# Patient Record
Sex: Male | Born: 1965 | Hispanic: Yes | Marital: Married | Smoking: Never smoker
Health system: Southern US, Community
[De-identification: ages and names within clinical notes are randomized; demographics above are authoritative.]

## PROBLEM LIST (undated history)

## (undated) DIAGNOSIS — K219 Gastro-esophageal reflux disease without esophagitis: Secondary | ICD-10-CM

## (undated) DIAGNOSIS — E785 Hyperlipidemia, unspecified: Secondary | ICD-10-CM

## (undated) HISTORY — DX: Hyperlipidemia, unspecified: E78.5

## (undated) HISTORY — PX: HERNIA REPAIR: SHX51

## (undated) HISTORY — DX: Gastro-esophageal reflux disease without esophagitis: K21.9

## (undated) HISTORY — PX: APPENDECTOMY: SHX54

---

## 2019-10-30 ENCOUNTER — Telehealth: Payer: Self-pay | Admitting: Nurse Practitioner

## 2019-10-30 ENCOUNTER — Other Ambulatory Visit: Payer: Self-pay

## 2019-10-30 ENCOUNTER — Emergency Department (HOSPITAL_COMMUNITY): Payer: HRSA Program

## 2019-10-30 ENCOUNTER — Emergency Department (HOSPITAL_COMMUNITY)
Admission: EM | Admit: 2019-10-30 | Discharge: 2019-10-30 | Disposition: A | Discharge status: Home / Self Care | Payer: HRSA Program | Attending: Emergency Medicine | Admitting: Emergency Medicine

## 2019-10-30 ENCOUNTER — Encounter (HOSPITAL_COMMUNITY): Payer: Self-pay | Admitting: Emergency Medicine

## 2019-10-30 DIAGNOSIS — R05 Cough: Secondary | ICD-10-CM | POA: Diagnosis present

## 2019-10-30 DIAGNOSIS — J069 Acute upper respiratory infection, unspecified: Secondary | ICD-10-CM

## 2019-10-30 DIAGNOSIS — R110 Nausea: Secondary | ICD-10-CM | POA: Insufficient documentation

## 2019-10-30 DIAGNOSIS — R509 Fever, unspecified: Secondary | ICD-10-CM | POA: Diagnosis not present

## 2019-10-30 DIAGNOSIS — R1084 Generalized abdominal pain: Secondary | ICD-10-CM | POA: Insufficient documentation

## 2019-10-30 DIAGNOSIS — R197 Diarrhea, unspecified: Secondary | ICD-10-CM | POA: Diagnosis not present

## 2019-10-30 DIAGNOSIS — U071 COVID-19: Secondary | ICD-10-CM | POA: Insufficient documentation

## 2019-10-30 LAB — CBC
HCT: 41.4 % (ref 39.0–52.0)
Hemoglobin: 14.2 g/dL (ref 13.0–17.0)
MCH: 29 pg (ref 26.0–34.0)
MCHC: 34.3 g/dL (ref 30.0–36.0)
MCV: 84.5 fL (ref 80.0–100.0)
Platelets: 127 10*3/uL — ABNORMAL LOW (ref 150–400)
RBC: 4.9 MIL/uL (ref 4.22–5.81)
RDW: 12 % (ref 11.5–15.5)
WBC: 3.6 10*3/uL — ABNORMAL LOW (ref 4.0–10.5)
nRBC: 0 % (ref 0.0–0.2)

## 2019-10-30 LAB — URINALYSIS, ROUTINE W REFLEX MICROSCOPIC
Bacteria, UA: NONE SEEN
Bilirubin Urine: NEGATIVE
Glucose, UA: NEGATIVE mg/dL
Hgb urine dipstick: NEGATIVE
Ketones, ur: NEGATIVE mg/dL
Leukocytes,Ua: NEGATIVE
Nitrite: NEGATIVE
Protein, ur: 100 mg/dL — AB
Specific Gravity, Urine: 1.028 (ref 1.005–1.030)
pH: 6 (ref 5.0–8.0)

## 2019-10-30 LAB — COMPREHENSIVE METABOLIC PANEL
ALT: 21 U/L (ref 0–44)
AST: 41 U/L (ref 15–41)
Albumin: 3.4 g/dL — ABNORMAL LOW (ref 3.5–5.0)
Alkaline Phosphatase: 62 U/L (ref 38–126)
Anion gap: 10 (ref 5–15)
BUN: 14 mg/dL (ref 6–20)
CO2: 23 mmol/L (ref 22–32)
Calcium: 8.2 mg/dL — ABNORMAL LOW (ref 8.9–10.3)
Chloride: 100 mmol/L (ref 98–111)
Creatinine, Ser: 0.87 mg/dL (ref 0.61–1.24)
GFR calc Af Amer: >60 mL/min (ref >60)
GFR calc non Af Amer: >60 mL/min (ref >60)
Glucose, Bld: 118 mg/dL — ABNORMAL HIGH (ref 70–99)
Potassium: 3.8 mmol/L (ref 3.5–5.1)
Sodium: 133 mmol/L — ABNORMAL LOW (ref 135–145)
Total Bilirubin: 1.4 mg/dL — ABNORMAL HIGH (ref 0.3–1.2)
Total Protein: 6.7 g/dL (ref 6.5–8.1)

## 2019-10-30 LAB — POC SARS CORONAVIRUS 2 AG -  ED: SARS Coronavirus 2 Ag: NEGATIVE

## 2019-10-30 LAB — LIPASE, BLOOD: Lipase: 32 U/L (ref 11–51)

## 2019-10-30 LAB — SARS CORONAVIRUS 2 (TAT 6-24 HRS): SARS Coronavirus 2: POSITIVE — AB

## 2019-10-30 MED ORDER — SODIUM CHLORIDE 0.9 % IV BOLUS
1000.0000 mL | Freq: Once | INTRAVENOUS | Status: AC
  Administered 2019-10-30: 17:00:00 1000 mL via INTRAVENOUS

## 2019-10-30 MED ORDER — ACETAMINOPHEN 325 MG PO TABS
650.0000 mg | ORAL_TABLET | Freq: Once | ORAL | Status: AC
  Administered 2019-10-30: 650 mg via ORAL

## 2019-10-30 MED ORDER — ONDANSETRON HCL 4 MG PO TABS: 4.0000 mg | ORAL_TABLET | Freq: Four times a day (QID) | ORAL | 0 refills | Status: DC

## 2019-10-30 MED ORDER — ONDANSETRON HCL 4 MG/2ML IJ SOLN
4.0000 mg | Freq: Once | INTRAMUSCULAR | Status: AC
  Administered 2019-10-30: 17:00:00 4 mg via INTRAVENOUS
  Filled 2019-10-30: qty 2

## 2019-10-30 MED ORDER — HYDROCOD POLST-CPM POLST ER 10-8 MG/5ML PO SUER
5.0000 mL | Freq: Once | ORAL | Status: AC
  Administered 2019-10-30: 5 mL via ORAL
  Filled 2019-10-30: qty 5

## 2019-10-30 MED ORDER — ACETAMINOPHEN 325 MG PO TABS
650.0000 mg | ORAL_TABLET | Freq: Once | ORAL | Status: AC
  Administered 2019-10-30: 650 mg via ORAL
  Filled 2019-10-30: qty 2

## 2019-10-30 MED ORDER — PROMETHAZINE-DM 6.25-15 MG/5ML PO SYRP: 5.0000 mL | ORAL_SOLUTION | Freq: Four times a day (QID) | ORAL | 0 refills | Status: DC | PRN

## 2019-10-30 NOTE — ED Provider Notes (Signed)
[facility] EMERGENCY DEPARTMENT Provider Note   CSN: [id number] Arrival date & time: 10/30/19  1108     History Chief Complaint  Patient presents with  . Cough  . Abdominal Pain    Dean Henry is a 54 y.o. male.  HPI 54 year old male with no significant medical history presents the ER complaining of fever, nausea, diarrhea, cough for approximately 10 days now.  History provided by the patient and his daughter-in-law who served as a Optometrist.  Approximately a month ago his son and daughter tested positive for Covid.  He states that he has had worsening cough, having coughing spells that make it hard for him to breathe with associated dizziness, shortness of breath for approximately 10 days now.  He states he feels as though there is something in his chest that is preventing him from breathing.  Endorses diffuse abdominal pain and watery diarrhea but no vomiting.  Endorses loss of taste and smell.  Endorses chest pain, but no palpitations, syncope, no radiation to shoulder or down arms, no swelling of the calfs/legs.  He has tried taking Tessalon Perles, Tylenol, Robitussin for his symptoms with minimal relief.  Denies hemoptysis, syncope, hematuria, dysuria, hematochezia.  He does not have a history of COPD, MIs, heart failure, PE, hypertension, AAA.     History reviewed. No pertinent past medical history.  There are no problems to display for this patient.   History reviewed. No pertinent surgical history.     No family history on file.  Social History   Tobacco Use  . Smoking status: Not on file  Substance Use Topics  . Alcohol use: Not on file  . Drug use: Not on file    Home Medications Prior to Admission medications   Medication Sig Start Date End Date Taking? Authorizing Provider  ondansetron (ZOFRAN) 4 MG tablet Take 1 tablet (4 mg total) by mouth every 6 (six) hours. 10/30/19   Garald Balding, PA-C  promethazine-dextromethorphan  (PROMETHAZINE-DM) 6.25-15 MG/5ML syrup Take 5 mLs by mouth 4 (four) times daily as needed for cough. 10/30/19   Garald Balding, PA-C    Allergies    Patient has no allergy information on record.  Review of Systems   Review of Systems  Constitutional: Positive for activity change, appetite change, chills, fatigue and fever.  HENT: Negative for ear pain, sinus pressure, sinus pain, sneezing, sore throat and trouble swallowing.   Respiratory: Positive for cough, chest tightness and shortness of breath. Negative for choking and wheezing.   Cardiovascular: Positive for chest pain. Negative for palpitations.  Gastrointestinal: Positive for abdominal pain, diarrhea and nausea. Negative for blood in stool, constipation and vomiting.  Genitourinary: Negative for dysuria, flank pain and hematuria.  Musculoskeletal: Positive for arthralgias and myalgias. Negative for back pain, neck pain and neck stiffness.  Skin: Negative for rash.  Neurological: Positive for dizziness, light-headedness and headaches. Negative for syncope and numbness.  Psychiatric/Behavioral: Negative for confusion.    Physical Exam Updated Vital Signs BP 115/75 (BP Location: Left Arm)   Pulse (!) 106   Temp (!) 103.2 F (39.6 C) (Oral)   Resp 20   Ht 5\' 7"  (1.702 m)   Wt 104.3 kg   SpO2 95%   BMI 36.02 kg/m   Physical Exam Vitals and nursing note reviewed.  Constitutional:      Appearance: He is well-developed.  HENT:     Head: Normocephalic and atraumatic.  Eyes:     Conjunctiva/sclera: Conjunctivae normal.  Cardiovascular:     Rate and Rhythm: Regular rhythm. Tachycardia present.     Pulses:          Radial pulses are 2+ on the right side and 2+ on the left side.       Dorsalis pedis pulses are 2+ on the right side and 2+ on the left side.     Heart sounds: Normal heart sounds. No murmur.  Pulmonary:     Effort: Pulmonary effort is normal. No respiratory distress.     Breath sounds: Normal breath sounds.    Abdominal:     General: Bowel sounds are increased. There is no distension.     Palpations: Abdomen is soft.     Tenderness: There is generalized abdominal tenderness. There is no right CVA tenderness, left CVA tenderness or guarding. Negative signs include Murphy's sign and McBurney's sign.     Hernia: No hernia is present.  Musculoskeletal:     Cervical back: Neck supple.     Right lower leg: No edema.     Left lower leg: No edema.  Skin:    General: Skin is warm and dry.  Neurological:     Mental Status: He is alert.     ED Results / Procedures / Treatments   Labs (all labs ordered are listed, but only abnormal results are displayed) Labs Reviewed  COMPREHENSIVE METABOLIC PANEL - Abnormal; Notable for the following components:      Result Value   Sodium 133 (*)    Glucose, Bld 118 (*)    Calcium 8.2 (*)    Albumin 3.4 (*)    Total Bilirubin 1.4 (*)    All other components within normal limits  CBC - Abnormal; Notable for the following components:   WBC 3.6 (*)    Platelets 127 (*)    All other components within normal limits  URINALYSIS, ROUTINE W REFLEX MICROSCOPIC - Abnormal; Notable for the following components:   Color, Urine AMBER (*)    Protein, ur 100 (*)    All other components within normal limits  SARS CORONAVIRUS 2 (TAT 6-24 HRS)  LIPASE, BLOOD  POC SARS CORONAVIRUS 2 AG -  ED    EKG EKG Interpretation  Date/Time:  Wednesday October 30 2019 16:46:28 EDT Ventricular Rate:  105 PR Interval:  164 QRS Duration: 84 QT Interval:  342 QTC Calculation: 452 R Axis:   67 Text Interpretation: Sinus tachycardia Otherwise normal ECG No old tracing to compare Confirmed by Dorie Rank [phone]) on 10/30/2019 4:57:34 PM   Radiology DG Chest Portable 1 View  Result Date: 10/30/2019 CLINICAL DATA:  Cough, fever EXAM: PORTABLE CHEST 1 VIEW COMPARISON:  12/10/2015 FINDINGS: The heart size and mediastinal contours are within normal limits. Subtle patchy airspace opacities  are seen involving the peripheral aspects of both lungs. No pleural effusion or pneumothorax identified. The visualized skeletal structures are unremarkable. IMPRESSION: Subtle patchy airspace opacities involving the peripheral aspects of both lungs. Findings concerning for atypical/viral infection versus edema. Electronically Signed   By: Davina Poke D.O.   On: 10/30/2019 16:42    Procedures Procedures (including critical care time)  Medications Ordered in ED Medications  acetaminophen (TYLENOL) tablet 650 mg (650 mg Oral Given 10/30/19 1124)  ondansetron (ZOFRAN) injection 4 mg (4 mg Intravenous Given 10/30/19 1639)  sodium chloride 0.9 % bolus 1,000 mL (0 mLs Intravenous Stopped 10/30/19 1819)  chlorpheniramine-HYDROcodone (TUSSIONEX) 10-8 MG/5ML suspension 5 mL (5 mLs Oral Given 10/30/19 1639)  acetaminophen (TYLENOL) tablet  650 mg (650 mg Oral Given 10/30/19 1830)    ED Course  I have reviewed the triage vital signs and the nursing notes.  Pertinent labs & imaging results that were available during my care of the patient were reviewed by me and considered in my medical decision making (see chart for details).    MDM Rules/Calculators/A&P                     54 year old male with complaints of fever, cough, diarrhea for approximately 10 days. On presentation to the ER, patient is ill-appearing, but in no acute distress, having intermittent coughing spells.  Patient is able to speak in full sentences without accessory muscle use or respiratory distress.  He is febrile with a temperature of 103 on arrival to the ED, tachycardic, normotensive, satting 94% on room air. On physical exam, clear lungs, diffuse abdominal tenderness to palpation.  No noticeable swelling to the legs or calves. CMP with mild hyponatremia of 133, likely due to diarrhea and dehydration.  CBC with mild leukopenia.  Urinalysis consistent with dehydration.  Overall without significant electrolyte abnormalities or renal  dysfunction.  Lipase negative.  Chest x-ray shows patchy airspace opacities concerning for atypical/viral infection versus edema.  EKG shows sinus tach with no ischemic changes.  Patient was given 2 rounds of Tylenol and 1 L fluid bolus while in the ER.  On reevaluation, he notes an improvement in his respiratory symptoms and nausea.  Repeat abdominal exam does not show signs of an acute abdomen.  He is not having any chest pain currently or showing signs of decompensation.  Shortness of breath slightly improved.  Lungs sound clear.  Given this clinical picture, symptoms, and along with his recent exposure to COVID-19 from his family, I suspect this patient's symptoms are due to  COVID-19. Although rapid test is negative, will order the 6-24hr COVID test. Expect this to be positive. Chest x-ray suggestive of atypical/viral etiology of symptoms with patchy airspace opacities further confirming my suspicion.    Other differentials include pneumonia, COPD, PE, ACS, dissection, HF, AAA, gastritis, gastroenteritis, pancreatitis, PUD, cholecystitis, appendicitis, diverticulitis.  Discussed pending results of the COVID-19 6 to 24-hour test.  Instructed patient to quarantine until the results of his tests are known, and he may require further quarantine if his test is positive.  Drink lots of fluids, continue Tylenol for fever control.  Prescribed Promethazine DM for cough and Zofran for nausea.  Strict return precautions given to him and his daughter in law, which included worsening shortness of breath, chest pain that does not seem to improve, worsening cough, persistent fever, syncope, increased confusion, etc.   Final Clinical Impression(s) / ED Diagnoses Final diagnoses:  Viral URI    Rx / DC Orders ED Discharge Orders         Ordered    promethazine-dextromethorphan (PROMETHAZINE-DM) 6.25-15 MG/5ML syrup  4 times daily PRN     10/30/19 1814    ondansetron (ZOFRAN) 4 MG tablet  Every 6 hours      10/30/19 1814           Lyndel Safe 10/30/19 1910    Dorie Rank, MD 10/31/19 1032

## 2019-10-30 NOTE — ED Notes (Signed)
Verbalized understanding of DC instructions, Rx, follow up care 

## 2019-10-30 NOTE — Discharge Instructions (Addendum)
You were seen in the ER for cough, fever, nausea and diarrhea. It is likely that your symptoms are caused by COVID-19. Your COVID test may take up to 24-48 hours to come back. You may check this via MyChart and if it is positive you will receive a call from the Health Department. . Please make sure to quarantine while you wait for the resuts, and quarantine 7-10 days if your test is positive. Please follow up with your family doctor.  Make sure to drink lots of fluids and take Tylenol for fevers. I have prescribed a nausea medication called Zofran and Phenergan syrup for cough. Please take as directed. Return to the ER if symptoms worsen.

## 2019-10-30 NOTE — ED Triage Notes (Signed)
Pt endorses fever and cough for 12 days. Son and daughter had covid about a month ago. Endorses nausea and diarrhea.

## 2019-11-01 ENCOUNTER — Telehealth: Payer: Self-pay

## 2019-11-01 ENCOUNTER — Ambulatory Visit: Payer: Self-pay | Admitting: Nurse Practitioner

## 2019-11-01 ENCOUNTER — Telehealth (HOSPITAL_COMMUNITY): Payer: Self-pay | Admitting: Nurse Practitioner

## 2019-11-01 DIAGNOSIS — U071 COVID-19: Secondary | ICD-10-CM

## 2019-11-01 NOTE — Telephone Encounter (Signed)
Called to Discuss with patient about Covid symptoms and the use of bamlanivimab, a monoclonal antibody infusion for those with mild to moderate Covid symptoms and at a high risk of hospitalization.     Pt is qualified for this infusion at the [facility] infusion center due to co-morbid conditions and/or a member of an at-risk group.     Unable to reach pt. With the assistance of an interpreter due to language barrier, a message was left with phone number for return call.   Beckey Rutter, [place], AGNP-C [phone] ([place])

## 2019-11-01 NOTE — Telephone Encounter (Signed)
Raiford Noble called to report that her Dad was seen in the ED at [facility] 48 hours ago and generally is not feeling better.  His rapid covid test was negative but they have not been informed of his send-out results.  The results are positive and patient's daughter is going to call the infusion center and make them aware that her Dad was seen in the ED.

## 2019-11-02 ENCOUNTER — Emergency Department (HOSPITAL_COMMUNITY): Payer: HRSA Program

## 2019-11-02 ENCOUNTER — Encounter (HOSPITAL_COMMUNITY): Payer: Self-pay | Admitting: Emergency Medicine

## 2019-11-02 ENCOUNTER — Other Ambulatory Visit: Payer: Self-pay

## 2019-11-02 ENCOUNTER — Emergency Department (HOSPITAL_COMMUNITY)
Admission: EM | Admit: 2019-11-02 | Discharge: 2019-11-02 | Disposition: A | Discharge status: Home / Self Care | Payer: HRSA Program | Attending: Emergency Medicine | Admitting: Emergency Medicine

## 2019-11-02 DIAGNOSIS — R05 Cough: Secondary | ICD-10-CM | POA: Diagnosis present

## 2019-11-02 DIAGNOSIS — J1282 Pneumonia due to coronavirus disease 2019: Secondary | ICD-10-CM | POA: Insufficient documentation

## 2019-11-02 DIAGNOSIS — Z79899 Other long term (current) drug therapy: Secondary | ICD-10-CM | POA: Insufficient documentation

## 2019-11-02 DIAGNOSIS — U071 COVID-19: Secondary | ICD-10-CM | POA: Diagnosis not present

## 2019-11-02 LAB — BASIC METABOLIC PANEL
Anion gap: 12 (ref 5–15)
BUN: 9 mg/dL (ref 6–20)
CO2: 24 mmol/L (ref 22–32)
Calcium: 8.2 mg/dL — ABNORMAL LOW (ref 8.9–10.3)
Chloride: 99 mmol/L (ref 98–111)
Creatinine, Ser: 0.83 mg/dL (ref 0.61–1.24)
GFR calc Af Amer: >60 mL/min (ref >60)
GFR calc non Af Amer: >60 mL/min (ref >60)
Glucose, Bld: 134 mg/dL — ABNORMAL HIGH (ref 70–99)
Potassium: 3.6 mmol/L (ref 3.5–5.1)
Sodium: 135 mmol/L (ref 135–145)

## 2019-11-02 LAB — URINALYSIS, ROUTINE W REFLEX MICROSCOPIC
Bacteria, UA: NONE SEEN
Bilirubin Urine: NEGATIVE
Glucose, UA: NEGATIVE mg/dL
Hgb urine dipstick: NEGATIVE
Ketones, ur: NEGATIVE mg/dL
Leukocytes,Ua: NEGATIVE
Nitrite: NEGATIVE
Protein, ur: 100 mg/dL — AB
Specific Gravity, Urine: 1.028 (ref 1.005–1.030)
pH: 6 (ref 5.0–8.0)

## 2019-11-02 LAB — CBC WITH DIFFERENTIAL/PLATELET
Abs Immature Granulocytes: 0.02 10*3/uL (ref 0.00–0.07)
Basophils Absolute: 0 10*3/uL (ref 0.0–0.1)
Basophils Relative: 0 %
Eosinophils Absolute: 0 10*3/uL (ref 0.0–0.5)
Eosinophils Relative: 0 %
HCT: 39.7 % (ref 39.0–52.0)
Hemoglobin: 13.4 g/dL (ref 13.0–17.0)
Immature Granulocytes: 1 %
Lymphocytes Relative: 18 %
Lymphs Abs: 0.6 10*3/uL — ABNORMAL LOW (ref 0.7–4.0)
MCH: 29.1 pg (ref 26.0–34.0)
MCHC: 33.8 g/dL (ref 30.0–36.0)
MCV: 86.3 fL (ref 80.0–100.0)
Monocytes Absolute: 0.3 10*3/uL (ref 0.1–1.0)
Monocytes Relative: 7 %
Neutro Abs: 2.5 10*3/uL (ref 1.7–7.7)
Neutrophils Relative %: 74 %
Platelets: 175 10*3/uL (ref 150–400)
RBC: 4.6 MIL/uL (ref 4.22–5.81)
RDW: 12.2 % (ref 11.5–15.5)
WBC: 3.4 10*3/uL — ABNORMAL LOW (ref 4.0–10.5)
nRBC: 0 % (ref 0.0–0.2)

## 2019-11-02 MED ORDER — DOXYCYCLINE HYCLATE 100 MG PO TABS
100.0000 mg | ORAL_TABLET | Freq: Once | ORAL | Status: AC
  Administered 2019-11-02: 100 mg via ORAL
  Filled 2019-11-02: qty 1

## 2019-11-02 MED ORDER — DOXYCYCLINE HYCLATE 100 MG PO TABS: 100.0000 mg | ORAL_TABLET | Freq: Two times a day (BID) | ORAL | 0 refills | Status: DC

## 2019-11-02 MED ORDER — DEXAMETHASONE 4 MG PO TABS: 4.0000 mg | ORAL_TABLET | Freq: Two times a day (BID) | ORAL | 0 refills | Status: DC

## 2019-11-02 MED ORDER — DEXAMETHASONE 4 MG PO TABS
4.0000 mg | ORAL_TABLET | Freq: Once | ORAL | Status: AC
  Administered 2019-11-02: 4 mg via ORAL
  Filled 2019-11-02: qty 1

## 2019-11-02 NOTE — ED Notes (Signed)
Pt ambulatory in room, denies [facility] with exertion, o2 sats remain at 96 % on RA, HR 100.

## 2019-11-02 NOTE — ED Triage Notes (Signed)
Patient tested positive for COVID19 on 10/30/2019 reports fever , chills , lost of taste , cough , fatigue , abdominal pain and body aches .

## 2019-11-02 NOTE — Discharge Instructions (Signed)
Return if any problems.

## 2019-11-02 NOTE — ED Provider Notes (Signed)
[facility] EMERGENCY DEPARTMENT Provider Note   CSN: [id number] Arrival date & time: 11/02/19  0326     History Chief Complaint  Patient presents with  . COVID+ / Fever / Chills/Body Aches / Abdominal pain    Dean Henry is a 54 y.o. male.  The history is provided by the patient. No language interpreter was used.  Cough Cough characteristics:  Productive Sputum characteristics:  Nondescript Severity:  Moderate Onset quality:  Gradual Duration:  15 days Timing:  Constant Progression:  Worsening Chronicity:  New Smoker: yes   Relieved by:  Nothing Worsened by:  Nothing Ineffective treatments:  None tried Associated symptoms: no shortness of breath   Risk factors: no recent infection   Pt tested positive for covid.  Pt reports increased shortness of breath     History reviewed. No pertinent past medical history.  There are no problems to display for this patient.   History reviewed. No pertinent surgical history.     No family history on file.  Social History   Tobacco Use  . Smoking status: Never Smoker  . Smokeless tobacco: Never Used  Substance Use Topics  . Alcohol use: Never  . Drug use: Never    Home Medications Prior to Admission medications   Medication Sig Start Date End Date Taking? Authorizing Provider  dexamethasone (DECADRON) 4 MG tablet Take 1 tablet (4 mg total) by mouth 2 (two) times daily with a meal. 11/02/19   Fransico Meadow, PA-C  doxycycline (VIBRA-TABS) 100 MG tablet Take 1 tablet (100 mg total) by mouth 2 (two) times daily. 11/02/19   Fransico Meadow, PA-C  ondansetron (ZOFRAN) 4 MG tablet Take 1 tablet (4 mg total) by mouth every 6 (six) hours. 10/30/19   Garald Balding, PA-C  promethazine-dextromethorphan (PROMETHAZINE-DM) 6.25-15 MG/5ML syrup Take 5 mLs by mouth 4 (four) times daily as needed for cough. 10/30/19   Garald Balding, PA-C    Allergies    Patient has no known allergies.  Review of Systems     Review of Systems  Respiratory: Positive for cough. Negative for shortness of breath.   All other systems reviewed and are negative.   Physical Exam Updated Vital Signs BP 127/76   Pulse 84   Temp 98.2 F (36.8 C) (Oral)   Resp 16   Ht 5\' 7"  (1.702 m)   Wt 110 kg   SpO2 96%   BMI 37.98 kg/m   Physical Exam Vitals reviewed.  HENT:     Mouth/Throat:     Mouth: Mucous membranes are moist.  Cardiovascular:     Rate and Rhythm: Normal rate.  Pulmonary:     Effort: Pulmonary effort is normal.     Breath sounds: Rhonchi present.  Abdominal:     General: Abdomen is flat.  Musculoskeletal:        General: Normal range of motion.     Cervical back: Normal range of motion.  Skin:    General: Skin is warm.  Neurological:     General: No focal deficit present.     Mental Status: He is alert.  Psychiatric:        Mood and Affect: Mood normal.     ED Results / Procedures / Treatments   Labs (all labs ordered are listed, but only abnormal results are displayed) Labs Reviewed  CBC WITH DIFFERENTIAL/PLATELET - Abnormal; Notable for the following components:      Result Value   WBC 3.4 (*)  Lymphs Abs 0.6 (*)    All other components within normal limits  BASIC METABOLIC PANEL - Abnormal; Notable for the following components:   Glucose, Bld 134 (*)    Calcium 8.2 (*)    All other components within normal limits  URINALYSIS, ROUTINE W REFLEX MICROSCOPIC - Abnormal; Notable for the following components:   Color, Urine AMBER (*)    Protein, ur 100 (*)    All other components within normal limits    EKG None  Radiology DG Chest Portable 1 View  Result Date: 11/02/2019 CLINICAL DATA:  COVID positive.  Fever short of breath EXAM: PORTABLE CHEST 1 VIEW COMPARISON:  Radiograph 10/30/2019 FINDINGS: Normal cardiac silhouette. There are patchy peripheral airspace opacities. No pleural fluid. No pneumothorax. No acute osseous abnormality. IMPRESSION: Patchy peripheral airspace  opacities consistent with COVID pneumonia. Electronically Signed   By: Suzy Bouchard M.D.   On: 11/02/2019 05:03    Procedures Procedures (including critical care time)  Medications Ordered in ED Medications  doxycycline (VIBRA-TABS) tablet 100 mg (100 mg Oral Given 11/02/19 0927)  dexamethasone (DECADRON) tablet 4 mg (4 mg Oral Given 11/02/19 [id number])    ED Course  I have reviewed the triage vital signs and the nursing notes.  Pertinent labs & imaging results that were available during my care of the patient were reviewed by me and considered in my medical decision making (see chart for details).    MDM Rules/Calculators/A&P                      MDM:  Chest xray shows pneumonia, Pt counseled on results,  Pt given rx for doxycycline and decadron.   Final Clinical Impression(s) / ED Diagnoses Final diagnoses:  Pneumonia due to COVID-19 virus    Rx / DC Orders ED Discharge Orders         Ordered    doxycycline (VIBRA-TABS) 100 MG tablet  2 times daily     11/02/19 0910    dexamethasone (DECADRON) 4 MG tablet  2 times daily with meals     11/02/19 0910        An After Visit Summary was printed and given to the patient.    Fransico Meadow, PA-C 11/02/19 1500    Isla Pence, MD 11/02/19 [phone]

## 2019-11-26 ENCOUNTER — Encounter: Payer: Self-pay | Admitting: Family Medicine

## 2019-11-26 NOTE — Progress Notes (Signed)
Subjective:  Patient ID: Dean Henry, male    DOB: 02/14/1966  Age: 54 y.o. MRN: [id number]  Chief Complaint  Patient presents with  . Gastroesophageal Reflux    HPI Patient is a Hispanic male who was seen in January by [facility] midlevel providers.  She tested him for H. pylori and he was subsequently treated for this.  He has heartburn has significantly improved.  He does occasionally have heartburn.  He does not take anything regularly although he does have omeprazole at home.  iSymptoms has been relieved with the H. pylori triple treatment.  Denies nausea, vomiting, diarrhea, constipation.  Patient was recently seen in [facility] emergency department in early April 2020 with COVID-19 pneumonia.  He was treated as an outpatient.  He did gradually improve and is feeling much better now.  His only remaining symptom is a cough.  He has been taking Robitussin.   The patient has a strong family history of colon cancer.  His older brother who died at age 63 was diagnosed with colon cancer. Medical history is significant for family history of colon cancer.  The patient denies ever having a colonoscopy, flexible sigmoidoscopy, or barium enema in the past.    Past Medical History:  Diagnosis Date  . GERD (gastroesophageal reflux disease)    Past Surgical History:  Procedure Laterality Date  . APPENDECTOMY      Family History  Problem Relation Age of Onset  . Cancer Mother        ovarian  . Kidney failure Father   . Cancer Brother        colon   Social History   Socioeconomic History  . Marital status: Married    Spouse name: Not on file  . Number of children: Not on file  . Years of education: Not on file  . Highest education level: Not on file  Occupational History  . Not on file  Tobacco Use  . Smoking status: Never Smoker  . Smokeless tobacco: Never Used  Substance and Sexual Activity  . Alcohol use: Yes    Comment: socially  . Drug use: Never  .  Sexual activity: Not on file  Other Topics Concern  . Not on file  Social History Narrative  . Not on file   Social Determinants of Health   Financial Resource Strain:   . Difficulty of Paying Living Expenses:   Food Insecurity:   . Worried About Charity fundraiser in the Last Year:   . Arboriculturist in the Last Year:   Transportation Needs:   . Film/video editor (Medical):   Marland Kitchen Lack of Transportation (Non-Medical):   Physical Activity:   . Days of Exercise per Week:   . Minutes of Exercise per Session:   Stress:   . Feeling of Stress :   Social Connections:   . Frequency of Communication with Friends and Family:   . Frequency of Social Gatherings with Friends and Family:   . Attends Religious Services:   . Active Member of Clubs or Organizations:   . Attends Archivist Meetings:   Marland Kitchen Marital Status:     Review of Systems  Constitutional: Negative for chills, diaphoresis, fatigue and fever.  HENT: Negative for congestion, ear pain and sore throat.   Respiratory: Negative for cough and shortness of breath.   Cardiovascular: Negative for chest pain and leg swelling.  Gastrointestinal: Negative for abdominal pain, constipation, diarrhea, nausea and  vomiting.  Genitourinary: Negative for dysuria and urgency.  Musculoskeletal: Negative for arthralgias and myalgias.  Neurological: Negative for dizziness and headaches.  Psychiatric/Behavioral: Negative for dysphoric mood.     Objective:  BP 110/66   Pulse 76   Temp (!) 97.4 F (36.3 C)   Resp 16   Ht 5\' 7"  (1.702 m)   Wt 221 lb 6.4 oz (100.4 kg)   BMI 34.68 kg/m   BP/Weight 11/28/2019 [id number]  Systolic BP [id number]  Diastolic BP 66 76 75  Wt. (Lbs) 221.4 242.51 230  BMI 34.68 37.98 36.02    Physical Exam Vitals reviewed.  Constitutional:      Appearance: Normal appearance. He is normal weight.  Cardiovascular:     Rate and Rhythm: Normal rate and regular rhythm.  Pulmonary:      Effort: Pulmonary effort is normal.     Breath sounds: Normal breath sounds.  Abdominal:     General: Abdomen is flat. Bowel sounds are normal.     Palpations: Abdomen is soft.  Neurological:     Mental Status: He is alert and oriented to person, place, and time.  Psychiatric:        Mood and Affect: Mood normal.        Behavior: Behavior normal.     Lab Results  Component Value Date   WBC 3.4 (L) 11/02/2019   HGB 13.4 11/02/2019   HCT 39.7 11/02/2019   PLT 175 11/02/2019   GLUCOSE 134 (H) 11/02/2019   ALT 21 10/30/2019   AST 41 10/30/2019   NA 135 11/02/2019   K 3.6 11/02/2019   CL 99 11/02/2019   CREATININE 0.83 11/02/2019   BUN 9 11/02/2019   CO2 24 11/02/2019      Assessment & Plan:  1. Mixed hyperlipidemia Recommend continue to work on eating healthy diet and exercise. Consider addition of cholesterol medicine. - Lipid panel   2. Gastroesophageal reflux disease without esophagitis Start on pepcid 20 mg one twice a day.   3. Family history of colon cancer - Ambulatory referral to Gastroenterology    Meds ordered this encounter  Medications  . famotidine (PEPCID) 20 MG tablet    Sig: Take 1 tablet (20 mg total) by mouth 2 (two) times daily.    Dispense:  60 tablet    Refill:  2    Orders Placed This Encounter  Procedures  . Lipid panel  . Ambulatory referral to Gastroenterology     Follow-up: Return in about 6 months (around 05/30/2020) for fasting.  An After Visit Summary was printed and given to the patient.  Rochel Brome [facility] [phone]

## 2019-11-28 ENCOUNTER — Encounter: Payer: Self-pay | Admitting: Family Medicine

## 2019-11-28 ENCOUNTER — Other Ambulatory Visit: Payer: Self-pay

## 2019-11-28 ENCOUNTER — Ambulatory Visit (INDEPENDENT_AMBULATORY_CARE_PROVIDER_SITE_OTHER): Payer: Self-pay | Admitting: Family Medicine

## 2019-11-28 VITALS — BP 110/66 | HR 76 | Temp 97.4°F | Resp 16 | Ht 67.0 in | Wt 221.4 lb

## 2019-11-28 DIAGNOSIS — Z800 Family history of malignant neoplasm of digestive organs: Secondary | ICD-10-CM

## 2019-11-28 DIAGNOSIS — K219 Gastro-esophageal reflux disease without esophagitis: Secondary | ICD-10-CM

## 2019-11-28 DIAGNOSIS — E782 Mixed hyperlipidemia: Secondary | ICD-10-CM

## 2019-11-28 MED ORDER — FAMOTIDINE 20 MG PO TABS: 20.0000 mg | ORAL_TABLET | Freq: Two times a day (BID) | ORAL | 2 refills | Status: DC

## 2019-11-28 NOTE — Patient Instructions (Signed)
Heartburn: start pepcid 20 mg one twice a day.  High cholesterol: low fat diet.  Referral to Gastroenterology for colonoscopy.   Plan de alimentacin restringido en grasas y colesterol Fat and Cholesterol Restricted Eating Plan El exceso de grasas y colesterol en la dieta puede causar problemas de [place]. Elegir los alimentos adecuados ayuda a [employer] niveles de grasas y [place]. Esto puede evitarle contraer ciertas enfermedades.  Cules son algunos consejos para seguir este plan? Planificacin de las comidas  En las comidas, [place] su plato en cuatro partes iguales: ? Llene la mitad del plato con verduras y ensaladas de hojas verdes. ? Llene un cuarto del plato con cereales integrales. ? Llene un cuarto del plato con alimentos con protenas con bajo contenido de grasas [employer]).  Coma pescado con alto contenido de grasas omega3 al [employer] veces por semana. Esas grasas se encuentran en la caballa, el atn, las sardinas y el salmn.  Coma alimentos con alto contenido de [place], como cereales [place], frijoles, [place], [place], [place], guisantes y [place]. Consejos generales   Si necesita adelgazar, consulte a su mdico.  Evite lo siguiente: ? Alimentos con Holiday representative. ? Comidas fritas. ? Alimentos con aceites parcialmente hidrogenados.  Limite el consumo de alcohol a no ms de 28medida por da si es mujer y no est [place], y 58medidas por da si es hombre. Una medida equivale a 12oz de Chartered certified accountant, 5oz de vino o 1oz de bebidas alcohlicas de alta graduacin. Lea las etiquetas de los alimentos  Lea las etiquetas de los alimentos para conocer lo siguiente: ? Si contienen grasas trans. ? Si contienen aceites parcialmente hidrogenados. ? La cantidad de grasas saturadas (g) que contiene cada porcin. ? La cantidad de colesterol (mg) que contiene cada porcin. ? La cantidad de fibra (g) que contiene cada porcin.  Elija alimentos con grasas  saludables, tales como las siguientes: ? Grasas monoinsaturadas. ? Grasas poliinsaturadas. ? Grasas omega3.  Elija productos de cereal que tengan cereales integrales. Busque la palabra "integral" en Equities trader de la lista de ingredientes. Al cocinar  Emplee mtodos de coccin con poca cantidad de grasa. Por ejemplo, hornear, hervir, grillar y Holiday representative.  Coma ms comidas caseras. Coma en restaurantes y bares con menos frecuencia.  Evite cocinar usando grasas saturadas, como [place], crema, aceite de [place], aceite de palmiste y aceite de [place]. [place] recomendados  Frutas  Frutas frescas, en conserva (en su jugo natural) o frutas congeladas. Verduras  Verduras frescas o congeladas (crudas, al vapor, asadas o grilladas). Ensaladas de hojas verdes. Granos  Cereales integrales, como panes, galletas, cereales y pastas de integrales o de trigo integral. Avena sin endulzar, trigo bulgur, cebada, quinua o arroz integral. Tortillas de harina de maz o trigo integral. Carnes y otros alimentos ricos en protenas  Carne de res molida (al 85% o ms magra), carne de res de animales alimentados con pastos o carne de res sin la grasa. Pollo o pavo sin piel. Carne de pollo o de [place]. Cerdo sin la grasa. Todos los pescados y frutos de mar. Claras de huevo. Porotos, guisantes o lentejas secos. Frutos secos o semillas sin sal. Frijoles enlatados sin sal. Mantequillas de frutos secos sin azcar ni aceite agregados. Lcteos  Productos lcteos descremados o semidescremados, como [employer] o al 1%, quesos reducidos en grasas o al 2%, queso cottage o ricota con bajo contenido de [place] o sin contenido de [place], o yogur natural descremado o semidescremado. Grasas y Solicitor untable  que no contenga grasas trans. [place] y condimentos para ensaladas livianos o reducidos en grasas. [place]. Aceites de oliva, canola, ssamo o crtamo. Es posible que los productos que se enumeran  ms [place] no constituyan una lista completa de los alimentos y las bebidas que puede tomar. Consulte a un nutricionista para obtener ms informacin. Alimentos que deben evitarse Frutas  Fruta enlatada en almbar espeso. Frutas con salsa de crema o mantequilla. Frutas cocidas en aceite. Verduras  Verduras cocinadas con salsas de queso, crema o mantequilla. Verduras fritas. Granos  Pan blanco. Pastas blancas. Arroz blanco. Pan de maz. Bagels, pasteles y croissants. Galletas saladas y colaciones que contengan grasas trans y aceites hidrogenados. Carnes y otros alimentos ricos en protenas  Cortes de carne con alto contenido de [place]. Costillas, alas de pollo, tocineta, salchicha, mortadela, salame, chinchulines, tocino, perros calientes, salchichas alemanas y embutidos envasados. Hgado y otros rganos. Huevos enteros y yemas de [place]. Pollo y pavo con piel. Carne frita. Lcteos  [place] entera o al 2%, crema, mezcla de [place] y crema, y queso crema. Quesos enteros. Yogur entero o endulzado. Quesos con toda su grasa. [place] no lcteas y coberturas batidas. Quesos procesados, quesos para untar y [place]. Bebidas  Alcohol. Bebidas endulzadas con azcar, como refrescos, limonada y bebidas frutales. Grasas y aceites  [place], [place] en barra, [place] de [place], [place], [place] clarificada o grasa de tocino. Aceites de coco, de palmiste y de palma. Dulces y postres  Jarabe de maz, azcares, miel y Control and instrumentation engineer. Caramelos. Mermeladas y [place]. Chrissie Noa. Cereales endulzados. Galletas, pasteles, bizcochuelos, donas, muffins y helado. Es posible que los productos que se enumeran ms [place] no constituyan una lista completa de los alimentos y las bebidas que Nurse, adult. Consulte a un nutricionista para obtener ms informacin. Resumen  Elegir los alimentos adecuados ayuda a Theatre manager normales los niveles de grasas y [place]. Esto puede evitarle contraer ciertas enfermedades.  En las  comidas, llene la mitad del plato con verduras y ensaladas de hojas verdes.  Coma alimentos con alto contenido de [place], como cereales [place], frijoles, [place], zanahorias, guisantes y [place].  Limite los alimentos con azcar agregada y grasas saturadas, el alcohol y las comidas fritas. Esta informacin no tiene Marine scientist el consejo del mdico. Asegrese de hacerle al mdico cualquier pregunta que tenga. Document Revised: 03/14/2018 Document Reviewed: 06/28/2017 Elsevier Patient Education  [place] lipdico Lipid Profile Test Por qu me debo realizar esta prueba? La prueba de perfil lipdico se puede utilizar para ayudar a Therapist, occupational de tener una enfermedad cardaca. La prueba tambin se [place] para [employer] durante el tratamiento para el colesterol alto, para determinar si alcanza sus objetivos. Qu se analiza? Un perfil lipdico mide lo siguiente:  Colesterol total. El colesterol es una sustancia cerosa y similar a la grasa que se encuentra en la [place]. Si su nivel de colesterol total es alto, puede aumentar el riesgo de sufrir enfermedad cardaca.  Lipoprotena de alta densidad (HDL). Tambin conocida como colesterol bueno. Tener niveles altos de HDL disminuye el riesgo de sufrir enfermedad cardaca. El nivel de HDL puede ser bajo si fuma o no realiza suficiente ejercicio.  Lipoprotena de baja densidad (LDL). Tambin conocida como colesterol malo. Este tipo hace que se acumulen placas en las arterias. Lo mejor es que el nivel de LDL sea bajo. Los niveles altos de LDL aumentan el riesgo de sufrir enfermedad cardaca.  Proporcin colesterol/HDL. Esto se calcula mediante la divisin del colesterol total por el  colesterol HDL. Los mdicos utilizan esta proporcin para determinar el riesgo de sufrir enfermedad cardaca. Lo mejor es que la proporcin sea baja.  Triglicridos. Estos son grasas que el organismo puede Financial controller o quemar  como fuente de [place]. Lo mejor es que el valor sea bajo. Tener niveles altos de triglicridos aumenta el riesgo de sufrir enfermedad cardaca. Qu tipo de [place] se toma?  Para esta prueba, se extrae Truddie Coco de [place]. Por lo general, para extraerla, se introduce una aguja en un vaso sanguneo. Cmo debo prepararme para esta prueba? No beba alcohol durante al menos 24horas antes de la prueba. Siga las indicaciones del mdico respecto de las restricciones de alimentos antes de la prueba. No coma ni beba nada que no sea agua despus de la medianoche anterior a la prueba o como se lo haya indicado el mdico. Informe al mdico acerca de lo siguiente:  Todos los [employer]ell [employer], incluidos vitaminas, hierbas, gotas oftlmicas, cremas y medicamentos de venta libre.  Cualquier afeccin mdica que tenga.  Si est embarazada o podra estarlo. Cmo se informan los resultados? Los [employer] de la prueba se informan como valores que indican los niveles de colesterol y triglicridos. Su mdico comparar sus resultados con los rangos normales que se establecieron luego de Optometrist la prueba a un grupo grande de personas (rangos de referencia). Los rangos de referencia pueden variar entre laboratorios y hospitales. Los rangos de referencia habituales para esta prueba son los siguientes: Colesterol total:  Adultos o ancianos: menos de 200mg /dl.  Nios: 120a 200mg /dl.  Bebs: 70a 175mg /dl.  Recin nacidos: 53a 135mg /dl. HDL  Hombres: ms de 45mg /dl.  Mujeres: ms de 55mg /dl. Valores de referencia de HDL basados en el riesgo de sufrir enfermedad cardaca:  Bajo riesgo de sufrir enfermedad cardaca: ? Hombres: 60 mg/dl. ? Mujeres: 70 mg/dl.  Riesgo moderado de sufrir enfermedad cardaca: ? Hombres: 45 mg/dl. ? Mujeres: 55 mg/dl.  Alto riesgo de sufrir enfermedad cardaca: ? Hombres: 25 mg/dl. ? Mujeres: 35 mg/dl. LDL  Adultos: su mdico determinar un nivel  objetivo para su nivel de LDL en funcin de su riesgo de sufrir enfermedad cardaca. ? Si tiene un riesgo bajo, el LDL debe ser de 130 mg/dl o menos. ? Si tiene un riesgo moderado, el LDL debe ser de 100 mg/dl o menos. ? Si tiene Electronics engineer, el LDL debe ser de 70 mg/dl o menos.  Nios: menos de 110mg /dl. Proporcin colesterol/HDL Valores de referencia basados en el riesgo de sufrir enfermedad cardaca:  Riesgo que es la mitad del riesgo promedio: ? Hombres: 3,4. ? Mujeres: 3,3.  Riesgo promedio: ? Hombres: 5,0. ? Mujeres: 4,4.  Riesgo que [employer] veces el riesgo promedio (riesgo moderado): ? Hombres: 10,0. ? Mujeres: 7,0.  Riesgo que es tres veces el riesgo promedio (alto riesgo): ? Hombres: 24,0. ? Mujeres: 11,0. Triglicridos  Adultos o ancianos: ? Hombres: 40 a 160mg /dl. ? Mujeres: 35a 135mg /dl.  Nios de 16a 19aos de edad: ? Hombres: 40a 163mg /dl. ? Mujeres: 40a 128mg /dl.  Nios de 12a 15aos de edad: ? Hombres: 36a 138mg /dl. ? Mujeres: 41a 138mg /dl.  Nios de 6a 11aos de edad: ? Hombres: 31a 108mg /dl. ? Mujeres: 35a 114mg /dl.  Nios de 0a 5 aos de edad: ? Hombres: 30a 86mg /dl. ? Mujeres: 32a 99mg /dl. El valor de triglicridos debe ser menos de 400mg /dl, incluso cuando no est en ayunas. [place] significan los resultados? Los [employer] Corporation estn dentro de los rangos de Biomedical engineer se consideran normales. Los niveles totales de [place],  LDL y triglicridos superiores a los rangos normales pueden significar que usted tiene ms riesgo de sufrir enfermedad cardaca. Un nivel de HDL que sea ms bajo que el rango de referencia tambin puede indicar un aumento en el riesgo. Hable con su mdico sobre lo que significan sus [place]. Preguntas para hacerle al mdico Consulte a su mdico o pregunte en el departamento donde se realiza la prueba acerca de lo siguiente:  Cundo estarn disponibles mis resultados?  Cmo obtendr mis  resultados?  Cules son mis opciones de tratamiento?  Qu otras pruebas necesito?  Cules son los prximos pasos que debo seguir? Resumen  La prueba de perfil lipdico puede ayudar a predecir la probabilidad de que usted tenga una enfermedad cardaca. [place], puede ayudar a Pilgr[employer] Pride de colesterol durante el tratamiento.  Un perfil lipdico mide el nivel total de colesterol, de lipoprotenas de alta densidad (HDL), lipoprotenas de baja densidad (LDL), la proporcin colesterol-HDL y los triglicridos.  Los niveles totales de colesterol, LDL y triglicridos superiores a los rangos normales pueden indicar un mayor riesgo de sufrir enfermedad cardaca.  Un nivel de HDL que sea ms bajo que el rango de referencia puede indicar un aumento en el riesgo de sufrir enfermedad cardaca.  Hable con su mdico sobre lo que significan sus [place]. Esta informacin no tiene Marine scientist el consejo del mdico. Asegrese de hacerle al mdico cualquier pregunta que tenga. Document Revised: 10/21/2017 Document Reviewed: 06/20/2017 Elsevier Patient Education  2020 [employer].

## 2019-11-29 ENCOUNTER — Other Ambulatory Visit: Payer: Self-pay

## 2019-11-29 LAB — LIPID PANEL
Chol/HDL Ratio: 5.5 ratio — ABNORMAL HIGH (ref 0.0–5.0)
Cholesterol, Total: 243 mg/dL — ABNORMAL HIGH (ref 100–199)
HDL: 44 mg/dL (ref >39)
LDL Chol Calc (NIH): 165 mg/dL — ABNORMAL HIGH (ref 0–99)
Triglycerides: 182 mg/dL — ABNORMAL HIGH (ref 0–149)
VLDL Cholesterol Cal: 34 mg/dL (ref 5–40)

## 2019-11-29 LAB — CARDIOVASCULAR RISK ASSESSMENT

## 2019-11-29 MED ORDER — ROSUVASTATIN CALCIUM 20 MG PO TABS: 20.0000 mg | ORAL_TABLET | Freq: Every day | ORAL | 1 refills | Status: DC

## 2019-12-12 ENCOUNTER — Other Ambulatory Visit: Payer: Self-pay

## 2019-12-12 ENCOUNTER — Ambulatory Visit (AMBULATORY_SURGERY_CENTER): Payer: Self-pay

## 2019-12-12 VITALS — Temp 97.3°F | Ht 67.0 in | Wt 222.6 lb

## 2019-12-12 DIAGNOSIS — Z800 Family history of malignant neoplasm of digestive organs: Secondary | ICD-10-CM

## 2019-12-12 NOTE — Progress Notes (Signed)
No allergies to soy or egg Pt is not on blood thinners or diet pills Denies issues with sedation/intubation Denies atrial flutter/fib Denies constipation   Emmi instructions given to pt  Pt is aware of Covid safety and care partner requirements.   Daughter in law present with pt and will act as interpretor-Dean Henry  Temp 97.2

## 2019-12-25 ENCOUNTER — Encounter: Payer: Self-pay | Admitting: Gastroenterology

## 2019-12-25 ENCOUNTER — Other Ambulatory Visit: Payer: Self-pay

## 2019-12-25 ENCOUNTER — Ambulatory Visit (AMBULATORY_SURGERY_CENTER): Payer: Self-pay | Admitting: Gastroenterology

## 2019-12-25 VITALS — BP 115/80 | HR 78 | Temp 97.5°F | Resp 24 | Ht 67.0 in | Wt 222.6 lb

## 2019-12-25 DIAGNOSIS — D125 Benign neoplasm of sigmoid colon: Secondary | ICD-10-CM

## 2019-12-25 DIAGNOSIS — K640 First degree hemorrhoids: Secondary | ICD-10-CM

## 2019-12-25 DIAGNOSIS — Z800 Family history of malignant neoplasm of digestive organs: Secondary | ICD-10-CM

## 2019-12-25 DIAGNOSIS — D122 Benign neoplasm of ascending colon: Secondary | ICD-10-CM

## 2019-12-25 DIAGNOSIS — K5730 Diverticulosis of large intestine without perforation or abscess without bleeding: Secondary | ICD-10-CM

## 2019-12-25 DIAGNOSIS — Z1211 Encounter for screening for malignant neoplasm of colon: Secondary | ICD-10-CM

## 2019-12-25 MED ORDER — SODIUM CHLORIDE 0.9 % IV SOLN
500.0000 mL | Freq: Once | INTRAVENOUS | Status: DC
Start: 2019-12-25 — End: 2019-12-25

## 2019-12-25 NOTE — Progress Notes (Signed)
pt tolerated well. VSS. awake and to recovery. Report given to RN.  

## 2019-12-25 NOTE — Progress Notes (Signed)
Vs DT ? ?Pt's states no medical or surgical changes since previsit or office visit. ? ?

## 2019-12-25 NOTE — Progress Notes (Signed)
Called to room to assist during endoscopic procedure.  Patient ID and intended procedure confirmed with present staff. Received instructions for my participation in the procedure from the performing physician.  

## 2019-12-25 NOTE — Op Note (Signed)
[place] Patient Name: Dean Henry Procedure Date: 12/25/2019 1:18 PM MRN: [id number] Endoscopist: Gerrit Heck , MD Age: 54 Referring MD:  Date of Birth: Sep 30, 1965 Gender: Male Account #: [id number] Procedure:                Colonoscopy Indications:              Screening in patient at increased risk: Colorectal                            cancer in brother before age 65, This is the                            patient's first colonoscopy Medicines:                Monitored Anesthesia Care Procedure:                Pre-Anesthesia Assessment:                           - Prior to the procedure, a History and Physical                            was performed, and patient medications and                            allergies were reviewed. The patient's tolerance of                            previous anesthesia was also reviewed. The risks                            and benefits of the procedure and the sedation                            options and risks were discussed with the patient.                            All questions were answered, and informed consent                            was obtained. Prior Anticoagulants: The patient has                            taken no previous anticoagulant or antiplatelet                            agents. ASA Grade Assessment: II - A patient with                            mild systemic disease. After reviewing the risks                            and benefits, the patient was deemed in  satisfactory condition to undergo the procedure.                           After obtaining informed consent, the colonoscope                            was passed under direct vision. Throughout the                            procedure, the patient's blood pressure, pulse, and                            oxygen saturations were monitored continuously. The                            Colonoscope was introduced through  the anus and                            advanced to the the cecum, identified by                            appendiceal orifice and ileocecal valve. The                            colonoscopy was performed without difficulty. The                            patient tolerated the procedure well. The quality                            of the bowel preparation was excellent. The                            ileocecal valve, appendiceal orifice, and rectum                            were photographed. Scope In: 1:51:24 PM Scope Out: 2:09:05 PM Scope Withdrawal Time: 0 hours 15 minutes 19 seconds  Total Procedure Duration: 0 hours 17 minutes 41 seconds  Findings:                 The perianal and digital rectal examinations were                            normal.                           A single small-mouthed diverticulum was found in                            the ascending colon.                           Two sessile polyps were found in the sigmoid colon  and ascending colon. The polyps were 3 to 5 mm in                            size. These polyps were removed with a cold snare.                            Resection and retrieval were complete. Estimated                            blood loss was minimal.                           Non-bleeding internal hemorrhoids were found during                            retroflexion. The hemorrhoids were small.                           The exam was otherwise normal throughout the                            remainder of the colon. Complications:            No immediate complications. Estimated Blood Loss:     Estimated blood loss was minimal. Impression:               - Diverticulosis in the ascending colon.                           - Two 3 to 5 mm polyps in the sigmoid colon and in                            the ascending colon, removed with a cold snare.                            Resected and retrieved.                            - Non-bleeding internal hemorrhoids. Recommendation:           - Patient has a contact number available for                            emergencies. The signs and symptoms of potential                            delayed complications were discussed with the                            patient. Return to normal activities tomorrow.                            Written discharge instructions were provided to the  patient.                           - Resume previous diet.                           - Continue present medications.                           - Await pathology results.                           - Repeat colonoscopy in 5 years for surveillance.                           - Return to GI office PRN.                           - Internal hemorrhoids were noted on this study and                            may be amenable to hemorrhoid band ligation. If you                            are interested in further treatment of these                            hemorrhoids with band ligation, please contact my                            clinic to set up an appointment for evaluation and                            treatment. Gerrit Heck, MD 12/25/2019 2:17:10 PM

## 2019-12-25 NOTE — Patient Instructions (Addendum)
USTED TUVO UN PROCEDIMIENTO ENDOSCPICO HOY EN EL [facility]:   Lea el informe del procedimiento que se le entreg para cualquier pregunta especfica sobre lo que se encontr durante su examen.  Si el informe del examen no responde a sus preguntas, por favor llame a su gastroenterlogo para aclararlo.  Si usted solicit que no se le den detalles de lo que se encontr en su procedimiento al acompaante que le va a cuidar, entonces el informe del procedimiento se ha incluido en un sobre sellado para que usted lo revise despus cuando le sea ms conveniente.   LO QUE PUEDE ESPERAR: Algunas sensaciones de hinchazn en el abdomen.  Puede tener ms gases de lo normal.  El caminar puede ayudarle a eliminar el aire que se le puso en el tracto gastrointestinal durante el procedimiento y reducir la hinchazn.  Si le hicieron una endoscopia inferior (como una colonoscopia o una sigmoidoscopia flexible), podra notar manchas de sangre en las heces fecales o en el papel higinico.  Si se someti a una preparacin intestinal para su procedimiento, es posible que no tenga una evacuacin intestinal normal durante algunos das.   Tenga en cuenta:  Es posible que note un poco de irritacin y congestin en la nariz o algn drenaje.  Esto es debido al oxgeno utilizado durante su procedimiento.  No hay que preocuparse y esto debe desaparecer ms o menos en un da.   SNTOMAS PARA REPORTAR INMEDIATAMENTE:  Despus de una endoscopia inferior (colonoscopia o sigmoidoscopia flexible):  Cantidades excesivas de sangre en las heces fecales  Sensibilidad significativa o empeoramiento de los dolores abdominales   Hinchazn aguda del abdomen que antes no tena   Fiebre de 100F o ms   Despus de la endoscopia superior (EGD)  Vmitos de sangre o material como caf molido   Dolor en el pecho o dolor debajo de los omplatos que antes no tena   Dolor o dificultad persistente para tragar  Falta de aire que antes no  tena   Fiebre de 100F o ms  Heces fecales negras y pegajosas   Para asuntos urgentes o de emergencia, puede comunicarse con un gastroenterlogo a cualquier hora llamando al [phone].  DIETA:  Recomendamos una comida pequea al principio, pero luego puede continuar con su dieta normal.  Tome muchos lquidos, pero debe evitar las bebidas alcohlicas durante 24 horas.    ACTIVIDAD:  Debe planear tomarse las cosas con calma por el resto del da y no debe CONDUCIR ni usar maquinaria pesada hasta maana (debido a los medicamentos de sedacin utilizados durante el examen).     SEGUIMIENTO: Nuestro personal llamar al nmero que aparece en su historial al siguiente da hbil de su procedimiento para ver cmo se siente y para responder cualquier pregunta o inquietud que pueda tener con respecto a la informacin que se le dio despus del procedimiento. Si no podemos contactarle, le dejaremos un mensaje.  Sin embargo, si se siente bien y no tiene ningn problema, no es necesario que nos devuelva la llamada.  Asumiremos que ha regresado a sus actividades diarias normales sin incidentes. Si se le tomaron algunas biopsias, le contactaremos por telfono o por carta en las prximas 3 semanas.  Si no ha sabido nada sobre las biopsias en el transcurso de 3 semanas, por favor llmenos al [phone].   FIRMAS/CONFIDENCIALIDAD: Usted y/o el acompaante que le cuide han firmado documentos que se ingresarn en su historial mdico electrnico.  Estas firmas atestiguan el hecho   de que la informacin anterior  

## 2019-12-27 ENCOUNTER — Telehealth: Payer: Self-pay | Admitting: *Deleted

## 2019-12-27 NOTE — Telephone Encounter (Signed)
First attempt, left VM.  

## 2019-12-27 NOTE — Telephone Encounter (Signed)
Pt called back stated he is feeling fine.

## 2020-01-06 ENCOUNTER — Encounter: Payer: Self-pay | Admitting: Gastroenterology

## 2020-02-11 ENCOUNTER — Other Ambulatory Visit: Payer: Self-pay

## 2020-02-11 ENCOUNTER — Encounter: Payer: Self-pay | Admitting: Gastroenterology

## 2020-02-11 ENCOUNTER — Ambulatory Visit: Payer: Self-pay | Admitting: Gastroenterology

## 2020-02-11 VITALS — BP 124/70 | HR 79 | Ht 66.0 in | Wt 232.1 lb

## 2020-02-11 DIAGNOSIS — Z800 Family history of malignant neoplasm of digestive organs: Secondary | ICD-10-CM

## 2020-02-11 DIAGNOSIS — K641 Second degree hemorrhoids: Secondary | ICD-10-CM

## 2020-02-11 DIAGNOSIS — Z86010 Personal history of colonic polyps: Secondary | ICD-10-CM

## 2020-02-11 NOTE — Patient Instructions (Addendum)
If you are age 56 or older, your body mass index should be between 23-30. Your Body mass index is 37.47 kg/m. If this is out of the aforementioned range listed, please consider follow up with your Primary Care Provider.  If you are age 82 or younger, your body mass index should be between 19-25. Your Body mass index is 37.47 kg/m. If this is out of the aformentioned range listed, please consider follow up with your Primary Care Provider.   HEMORRHOID BANDING PROCEDURE    FOLLOW-UP CARE   1. The procedure you have had should have been relatively painless since the banding of the area involved does not have nerve endings and there is no pain sensation.  The rubber band cuts off the blood supply to the hemorrhoid and the band may fall off as soon as 48 hours after the banding (the band may occasionally be seen in the toilet bowl following a bowel movement). You may notice a temporary feeling of fullness in the rectum which should respond adequately to plain Tylenol or Motrin.  2. Following the banding, avoid strenuous exercise that evening and resume full activity the next day.  A sitz bath (soaking in a warm tub) or bidet is soothing, and can be useful for cleansing the area after bowel movements.     3. To avoid constipation, take two tablespoons of natural wheat bran, natural oat bran, flax, Benefiber or any over the counter fiber supplement and increase your water intake to 7-8 glasses daily.    4. Unless you have been prescribed anorectal medication, do not put anything inside your rectum for two weeks: No suppositories, enemas, fingers, etc.  5. Occasionally, you may have more bleeding than usual after the banding procedure.  This is often from the untreated hemorrhoids rather than the treated one.  Don't be concerned if there is a tablespoon or so of blood.  If there is more blood than this, lie flat with your bottom higher than your head and apply an ice pack to the area. If the bleeding  does not stop within a half an hour or if you feel faint, call our office at [phone] or go to the emergency room.  6. Problems are not common; however, if there is a substantial amount of bleeding, severe pain, chills, fever or difficulty passing urine (very rare) or other problems, you should call us at [phone] or report to the nearest emergency room.  7. Do not stay seated continuously for more than 2-3 hours for a day or two after the procedure.  Tighten your buttock muscles 10-15 times every two hours and take 10-15 deep breaths every 1-2 hours.  Do not spend more than a few minutes on the toilet if you cannot empty your bowel; instead re-visit the toilet at a later time.  Follow up appointment on 03/25/20 at 9:40 am for banding number 2.    It was a pleasure to see you today!  Vito Cirigliano, D.O.

## 2020-02-11 NOTE — Progress Notes (Signed)
P  Chief Complaint:    Symptomatic Internal Hemorrhoids; Hemorrhoid Band Ligation  GI History: 54 year old male with symptomatic internal hemorrhoids (rectal itching/irritation, and intermittent BRBPR).  Also has family history of colon cancer (brother, diagnosed <age 36).  Endoscopic history: -Colonoscopy (12/25/2019, Dr. Bryan Henry): Diverticulosis ascending colon, 2 polyps (both tubular adenomas), internal hemorrhoids.  Repeat in 5 years due to family history  HPI:     Patient is a 53 y.o. malewith a history of symptomatic internal hemorrhoids presenting to [facility] for follow-up and ongoing treatment. The patient presents with symptomatic grade 2 hemorrhoids, unresponsive to maximal medical therapy, requesting rubber band ligation of symptomatic hemorrhoidal disease.  No change in medical or surgical history, medications, allergies, social history since last appointment with me.   Review of systems:     No chest pain, no SOB, no fevers, no urinary sx   Past Medical History:  Diagnosis Date  . GERD (gastroesophageal reflux disease)   . Hyperlipidemia    not taking medication.    Patient's surgical history, family medical history, social history, medications and allergies were all reviewed in Epic    Current Outpatient Medications  Medication Sig Dispense Refill  . famotidine (PEPCID) 20 MG tablet Take 1 tablet (20 mg total) by mouth 2 (two) times daily. (Patient not taking: Reported on 02/11/2020) 60 tablet 2  . rosuvastatin (CRESTOR) 20 MG tablet Take 1 tablet (20 mg total) by mouth daily. (Patient not taking: Reported on 02/11/2020) 90 tablet 1   No current facility-administered medications for this visit.    Physical Exam:     BP 124/70   Pulse 79   Ht 5\' 6"  (1.676 m)   Wt 232 lb 2 oz (105.3 kg)   BMI 37.47 kg/m   GENERAL:  Pleasant male in NAD PSYCH: : Cooperative, normal affect NEURO: Alert and oriented x 3, no focal neurologic  deficits Rectal exam: Sensation intact and preserved anal wink.  Grade 2 hemorrhoids noted in all positions on anoscopy.  No external anal fissures noted. Normal sphincter tone. No palpable mass. No blood on the exam glove. (Chaperone: Dean Henry, CMA).   IMPRESSION and PLAN:    #1.  Symptomatic internal hemorrhoids: PROCEDURE NOTE: The patient presents with symptomatic grade 2 hemorrhoids, unresponsive to maximal medical therapy, requesting rubber band ligation of symptomatic hemorrhoidal disease.  All risks, benefits and alternative forms of therapy were described and informed consent was obtained.  In the Left Lateral Decubitus position, anoscopic examination revealed grade 2 hemorrhoids in the all position(s).  The anorectum was pre-medicated with RectiCare. The decision was made to band the LL internal hemorrhoid, and the [place] was used to perform band ligation without complication.  Digital anorectal examination was then performed to assure proper positioning of the band, and to adjust the banded tissue as required.  The patient was discharged home without pain or other issues.  Dietary and behavioral recommendations were given and along with follow-up instructions.     The following adjunctive treatments were recommended:  -Resume high-fiber diet with fiber supplement (i.e. Citrucel or Benefiber) with goal for soft stools without straining to have a BM. -Resume adequate fluid intake.  The patient will return in 2-4 for  follow-up and possible additional banding as required. No complications were encountered and the patient tolerated the procedure well.      #2.  History of colon polyps/tubular adenomas #3.  Family history of colon cancer-brother diagnosed prior to age 68 -Colonoscopy  in 2021 with 2 subcentimeter tubular adenomas.  Repeat in 5 years (2026)      Dean Henry ,DO, [facility] 02/11/2020, 10:44 AM

## 2020-03-25 ENCOUNTER — Ambulatory Visit: Payer: Self-pay | Admitting: Gastroenterology

## 2020-05-28 NOTE — Progress Notes (Signed)
Subjective:  Patient ID: Dean Henry, Henry    DOB: 13-May-1966  Age: 54 y.o. MRN: [id number]  Chief Complaint  Patient presents with  . Gastroesophageal Reflux  . Hyperlipidemia    HPI Patient is a Dean Henry present for follow-up of GERD and hyperlipidemia. Spanish interpreter medical staff member present for communication.   GERD Patient was diagnosed with GERD. He is prescribed Pepcid 20 mg BID. States he does not take it as prescribed. States symptoms are well-controlled with diet and avoiding food triggers.   Hyperlipidemia Pt presents with hyperlipidemia. Patient was diagnosed in 2006. Compliance with treatment has been suboptimal;Last lipid panel on 11/29/19 reveals:  TC 243, Trig 182, HDL 44, LDL 165.The patient is not compliant with medications, does not maintain a low cholesterol diet or exercise regimen. He does follow-up as directed; he is fasting today. The patient denies experiencing any hypercholesterolemia related symptoms.     Past Medical History:  Diagnosis Date  . GERD (gastroesophageal reflux disease)   . Hyperlipidemia    not taking medication.   Past Surgical History:  Procedure Laterality Date  . APPENDECTOMY      Family History  Problem Relation Age of Onset  . Cancer Mother        ovarian  . Kidney failure Father   . Cancer Brother        colon  . Colon cancer Brother 40  . Colon polyps Neg Hx   . Esophageal cancer Neg Hx   . Stomach cancer Neg Hx   . Rectal cancer Neg Hx    Social History   Socioeconomic History  . Marital status: Married    Spouse name: Not on file  . Number of children: Not on file  . Years of education: Not on file  . Highest education level: Not on file  Occupational History  . Not on file  Tobacco Use  . Smoking status: Never Smoker  . Smokeless tobacco: Never Used  Vaping Use  . Vaping Use: Never used  Substance and Sexual Activity  . Alcohol use: Yes    Comment: socially  . Drug use:  Never  . Sexual activity: Not on file  Other Topics Concern  . Not on file  Social History Narrative  . Not on file   Social Determinants of Health   Financial Resource Strain:   . Difficulty of Paying Living Expenses: Not on file  Food Insecurity:   . Worried About Charity fundraiser in the Last Year: Not on file  . Ran Out of Food in the Last Year: Not on file  Transportation Needs:   . Lack of Transportation (Medical): Not on file  . Lack of Transportation (Non-Medical): Not on file  Physical Activity:   . Days of Exercise per Week: Not on file  . Minutes of Exercise per Session: Not on file  Stress:   . Feeling of Stress : Not on file  Social Connections:   . Frequency of Communication with Friends and Family: Not on file  . Frequency of Social Gatherings with Friends and Family: Not on file  . Attends Religious Services: Not on file  . Active Member of Clubs or Organizations: Not on file  . Attends Archivist Meetings: Not on file  . Marital Status: Not on file    Review of Systems  Constitutional: Negative for chills, diaphoresis, fatigue and fever.  HENT: Negative for congestion, ear pain and sore throat.   Respiratory: Negative  for cough and shortness of breath.   Cardiovascular: Negative for chest pain and leg swelling.  Gastrointestinal: Negative for abdominal pain, constipation, diarrhea, nausea and vomiting.  Endocrine: Negative for cold intolerance, heat intolerance, polydipsia, polyphagia and polyuria.  Genitourinary: Negative for difficulty urinating, dysuria, penile pain, scrotal swelling, testicular pain and urgency.       Left groin pain with vigorous physical activity (basketball)  Musculoskeletal: Negative for arthralgias, back pain, gait problem and myalgias.  Skin: Negative.   Allergic/Immunologic: Negative for environmental allergies, food allergies and immunocompromised state.  Neurological: Negative for dizziness and headaches.    Psychiatric/Behavioral: Negative for dysphoric mood.     Objective:  BP 110/64   Pulse 78   Temp (!) 97.3 F (36.3 C)   Resp 16   Ht 5\' 7"  (1.702 m)   Wt 236 lb (107 kg)   SpO2 98%   BMI 36.96 kg/m .  BP/Weight 02/11/2020 12/25/2019 7/61/9509  Systolic BP 326 712 -  Diastolic BP 70 80 -  Wt. (Lbs) 232.13 222.6 222.6  BMI 37.47 34.86 34.86    Physical Exam Vitals reviewed.  Constitutional:      Appearance: Normal appearance. He is normal weight.  HENT:     Head: Normocephalic.     Right Ear: Tympanic membrane normal.     Left Ear: Tympanic membrane normal.     Nose: Nose normal.     Mouth/Throat:     Mouth: Mucous membranes are moist.  Cardiovascular:     Rate and Rhythm: Normal rate and regular rhythm.  Pulmonary:     Effort: Pulmonary effort is normal.     Breath sounds: Normal breath sounds.  Abdominal:     General: Abdomen is flat. Bowel sounds are normal.     Palpations: Abdomen is soft.     Hernia: A hernia (left inguinal hernia) is present.  Musculoskeletal:        General: Normal range of motion.     Cervical back: Neck supple.  Skin:    General: Skin is warm and dry.     Capillary Refill: Capillary refill takes less than 2 seconds.  Neurological:     Mental Status: He is alert and oriented to person, place, and time.  Psychiatric:        Mood and Affect: Mood normal.        Behavior: Behavior normal.     Lab Results  Component Value Date   WBC 3.4 (L) 11/02/2019   HGB 13.4 11/02/2019   HCT 39.7 11/02/2019   PLT 175 11/02/2019   GLUCOSE 134 (H) 11/02/2019   CHOL 243 (H) 11/28/2019   TRIG 182 (H) 11/28/2019   HDL 44 11/28/2019   LDLCALC 165 (H) 11/28/2019   ALT 21 10/30/2019   AST 41 10/30/2019   NA 135 11/02/2019   K 3.6 11/02/2019   CL 99 11/02/2019   CREATININE 0.83 11/02/2019   BUN 9 11/02/2019   CO2 24 11/02/2019      Assessment & Plan:  1. Mixed hyperlipidemia Recommend continue to work on eating healthy diet and exercise.  Consider addition of cholesterol medicine. - Lipid panel   2. Gastroesophageal reflux disease without esophagitis -Continue Pepcid 20mg  BID -Avoid food triggers -Eat small frequent meals  3. Left inguinal hernia -Ambulatory referral to general surgeon -Avoid heavy lifting      Follow-up appointment in 6 months-fasting or before if needed Consume heart healthy diet Set goal to do 30 minutes of physical activity 3  days a week Avoid food triggers that aggravate acid reflux We will call you with appointment for referral for left inguinal (groin) hernia     Follow-up: Return in about 6 months (around 08/29/2020).  An After Visit Summary was printed and given to the patient.  Jerrell Belfast, DNP [facility] [phone]

## 2020-05-29 ENCOUNTER — Encounter: Payer: Self-pay | Admitting: Nurse Practitioner

## 2020-05-29 ENCOUNTER — Ambulatory Visit (INDEPENDENT_AMBULATORY_CARE_PROVIDER_SITE_OTHER): Payer: Self-pay | Admitting: Nurse Practitioner

## 2020-05-29 ENCOUNTER — Other Ambulatory Visit: Payer: Self-pay

## 2020-05-29 ENCOUNTER — Other Ambulatory Visit: Payer: Self-pay | Admitting: Nurse Practitioner

## 2020-05-29 VITALS — BP 110/64 | HR 78 | Temp 97.3°F | Resp 16 | Ht 67.0 in | Wt 236.0 lb

## 2020-05-29 DIAGNOSIS — Z6836 Body mass index (BMI) 36.0-36.9, adult: Secondary | ICD-10-CM

## 2020-05-29 DIAGNOSIS — K219 Gastro-esophageal reflux disease without esophagitis: Secondary | ICD-10-CM

## 2020-05-29 DIAGNOSIS — K4090 Unilateral inguinal hernia, without obstruction or gangrene, not specified as recurrent: Secondary | ICD-10-CM

## 2020-05-29 DIAGNOSIS — E782 Mixed hyperlipidemia: Secondary | ICD-10-CM

## 2020-05-29 LAB — CBC WITH DIFFERENTIAL/PLATELET
Basophils Absolute: 0 10*3/uL (ref 0.0–0.2)
Basos: 1 %
EOS (ABSOLUTE): 0.2 10*3/uL (ref 0.0–0.4)
Eos: 6 %
Hematocrit: 44.7 % (ref 37.5–51.0)
Hemoglobin: 15.1 g/dL (ref 13.0–17.7)
Immature Grans (Abs): 0 10*3/uL (ref 0.0–0.1)
Immature Granulocytes: 0 %
Lymphocytes Absolute: 1.6 10*3/uL (ref 0.7–3.1)
Lymphs: 47 %
MCH: 29.2 pg (ref 26.6–33.0)
MCHC: 33.8 g/dL (ref 31.5–35.7)
MCV: 86 fL (ref 79–97)
Monocytes Absolute: 0.4 10*3/uL (ref 0.1–0.9)
Monocytes: 12 %
Neutrophils Absolute: 1.1 10*3/uL — ABNORMAL LOW (ref 1.4–7.0)
Neutrophils: 34 %
Platelets: 144 10*3/uL — ABNORMAL LOW (ref 150–450)
RBC: 5.18 x10E6/uL (ref 4.14–5.80)
RDW: 13.2 % (ref 11.6–15.4)
WBC: 3.3 10*3/uL — ABNORMAL LOW (ref 3.4–10.8)

## 2020-05-29 MED ORDER — FAMOTIDINE 20 MG PO TABS: 20.0000 mg | ORAL_TABLET | Freq: Two times a day (BID) | ORAL | 1 refills | Status: DC

## 2020-05-29 NOTE — Patient Instructions (Addendum)
Follow-up appointment in 6 months-fasting or before if needed Consume heart healthy diet Set goal to do 30 minutes of physical activity 3 days a week Avoid food triggers that aggravate acid reflux We will call you with appointment for referral for left inguinal (groin) hernia  Cuidados preventivos en los hombres de 36 a 52 aos de edad Preventive Care 69-56 Years Old, Male Los cuidados preventivos hacen referencia a las opciones en cuanto al estilo de vida y a las visitas al mdico, las cuales pueden promover la salud y Musician. Esto puede comprender lo siguiente:  Un examen fsico anual. Esto tambin se conoce como control de bienestar anual.  Exmenes dentales y oculares de [place] regular.  Vacunas.  Estudios para Engineer, building services.  Opciones saludables de estilo de vida, como seguir una dieta saludable, hacer ejercicio regularmente, no usar drogas ni productos que contengan nicotina y tabaco, y limitar el consumo de alcohol. Qu puedo esperar para mi visita de cuidado preventivo? Examen fsico El mdico controlar lo siguiente:  IT consultant y [place]. Estos datos se pueden usar para calcular el ndice de masa corporal ([place]), una medicin que indica si usted tiene un peso saludable.  Frecuencia cardaca y presin arterial.  Piel para detectar manchas anormales. Asesoramiento El mdico puede hacerle preguntas sobre lo siguiente:  Consumo de tabaco, alcohol y drogas.  Bienestar emocional.  Bienestar en el hogar y sus relaciones personales.  Actividad sexual.  Hbitos de alimentacin.  Trabajo y [place] laboral. Sander Nephew vacunas necesito?  [place] antigripal  Se recomienda aplicarse esta vacuna todos los [place]. Vacuna contra el ttanos, la difteria y la tos ferina (Tdap)  Es posible que tenga que aplicarse un refuerzo contra el ttanos y la difteria (DT) cada 10aos. Vacuna contra la varicela  Es posible que tenga que aplicrsela si an no la  recibi. Vacuna contra el herpes zster (culebrilla)  Es posible que la necesite despus de los 32 aos de [place]. Vacuna contra el sarampin, la rubola y las paperas ([place])  Es posible que necesite aplicarse al menos una dosis de la vacuna SRP si naci despus de [phone]. Tambin es posible que necesite una segunda dosis. Vacuna antineumoccica conjugada (PCV13)  Puede necesitar esta vacuna si tiene determinadas enfermedades y no se vacun anteriormente. Edward Jolly antineumoccica de polisacridos (PPSV23)  Quizs tenga que aplicarse una o dos dosis si fuma o si tiene determinadas afecciones. Edward Jolly antimeningoccica conjugada (MenACWY)  Puede necesitar esta vacuna si tiene determinadas afecciones. Vacuna contra la hepatitis A  Es posible que necesite esta vacuna si tiene ciertas afecciones o si viaja o trabaja en lugares en los que podra estar expuesto a la hepatitis A. Vacuna contra la hepatitis B  Es posible que necesite esta vacuna si tiene ciertas afecciones o si viaja o trabaja en lugares en los que podra estar expuesto a la hepatitis B. Vacuna antihaemophilus influenzae tipo B (Hib)  Es posible que necesite esta vacuna si tiene algunos factores de [place]. Vacuna contra el virus del papiloma humano (VPH)  Si el mdico se lo recomienda, Research scientist (physical sciences) tres dosis a lo largo de 6 meses. Puede recibir las vacunas en forma de dosis individuales o en forma de dos o ms vacunas juntas en la misma inyeccin (vacunas combinadas). Hable con su mdico [employer] y beneficios de las vacunas combinadas. Qu pruebas necesito? Anlisis de [employer] de lpidos y colesterol. Estos se pueden verificar cada 5 aos o, con ms frecuencia, si usted tiene ms de 50  aos de edad.  Anlisis de hepatitisC.  Anlisis de hepatitisB. Pruebas de deteccin  Pruebas de deteccin de cncer de pulmn. Es posible que se le realice esta prueba de deteccin a partir de los 36 aos de edad, si ha  fumado durante 30 aos un paquete diario y sigue fumando o dej el hbito en algn momento en los ltimos 15 aos.  Examen de deteccin del cncer de prstata. Las recomendaciones variarn segn sus antecedentes familiares y Hydrologist.  Pruebas de Programme researcher, broadcasting/film/video de Surveyor, minerals. Todos los adultos a partir de los 58 aos de edad y [place] 64 aos de edad deben hacerse esta prueba de deteccin. El mdico puede recomendarle las pruebas de deteccin a partir de los 50 aos de edad si corre un mayor riesgo. Le realizarn pruebas cada 1 a 10 aos, segn los [place] y el tipo de prueba de Programme researcher, broadcasting/film/video.  Pruebas de deteccin de la diabetes. Esto se Set designer un control del azcar en la sangre (glucosa) despus de no haber comido durante un periodo de tiempo (ayuno). Es posible que se le realice esta prueba cada 1 a 3 aos.  Anlisis de enfermedades de transmisin sexual (ETS). Siga estas instrucciones en su casa: Comida y bebida  Siga una dieta que incluya frutas y verduras frescas, cereales integrales, protenas magras y productos lcteos descremados.  Tome los suplementos vitamnicos y [employer] se lo haya indicado el mdico.  No beba alcohol si el mdico se lo prohbe.  Si bebe alcohol: ? Limite la cantidad que consume de 0 a 2 medidas por da. ? Est atento a la cantidad de alcohol que hay en las bebidas que toma. En los [place], una medida equivale a una botella de cerveza de 12oz (347ml), un vaso de vino de 5oz (182ml) o un vaso de una bebida alcohlica de alta graduacin de 1oz (87ml). Estilo de [employer] Corporation y las encas a diario.  Mantngase activo. Haga al menos 41minutos de ejercicio 5o ms [employer].  No consuma ningn producto que contenga nicotina o tabaco, como cigarrillos, cigarrillos electrnicos y tabaco de Higher education careers adviser. Si necesita ayuda para dejar de fumar, consulte al mdico.  Si es sexualmente activo, practique sexo seguro.  Use un condn u otra forma de proteccin para prevenir las ITS (infecciones de transmisin sexual).  Hable con el mdico acerca de tomar una dosis baja de aspirina o estatina todos los das a partir de los 41 aos. Cundo volver?  Acuda al mdico una vez al ao para una visita de control.  Pregntele al mdico con qu frecuencia debe realizarse un control de la vista y los dientes.  Mantenga su esquema de vacunacin al da. Esta informacin no tiene Marine scientist el consejo del mdico. Asegrese de hacerle al mdico cualquier pregunta que tenga. Document Revised: 08/03/2018 Document Reviewed: 08/03/2018 Elsevier Patient Education  [place] de reflujo gastroesofgico en los adultos Gastroesophageal Reflux Disease, Adult El reflujo gastroesofgico (RGE) ocurre cuando el cido del estmago sube por el tubo que conecta la boca con el estmago (esfago). Normalmente, la comida baja por el esfago y se mantiene en el estmago, donde se la digiere. Cuando una persona tiene RGE, los alimentos y el cido estomacal suelen volver al esfago. Usted puede tener una enfermedad llamada enfermedad de reflujo gastroesofgico (ERGE) si el reflujo:  Sucede a menudo.  Causa sntomas frecuentes o muy intensos.  Causa problemas tales como dao en el esfago. Cuando  esto ocurre, el esfago duele y se hincha (inflama). Con el tiempo, la ERGE puede ocasionar pequeos agujeros (lceras) en el revestimiento del esfago. Cules son las causas? Esta afeccin se debe a un problema en el msculo que se encuentra entre el esfago y [place]. Cuando este msculo est dbil o no es normal, no se cierra correctamente para impedir que los alimentos y el cido regresen del Paramedic. El msculo puede debilitarse debido a lo siguiente:  El consumo de [place].  Los [place].  Tener cierto tipo de hernia (hernia de hiato).  Consumo de alcohol.  Ciertos alimentos y bebidas, como caf,  chocolate, cebollas y [place]. Qu incrementa el riesgo? Es ms probable que tenga esta afeccin si:  Tiene sobrepeso.  Tiene una enfermedad que afecta el tejido conjuntivo.  [place] antiinflamatorios no esteroideos (AINE). Cules son los signos o los sntomas? Los sntomas de esta afeccin incluyen:  Acidez estomacal.  Dificultad o dolor al tragar.  Sensacin de Best boy un bulto en la garganta.  Sabor amargo en la boca.  Mal aliento.  Tener una gran cantidad de saliva.  Estmago inflamado o con Tree surgeon.  Eructos.  Dolor en el pecho. El dolor de pecho puede deberse a distintas afecciones. Asegrese de Teacher, adult education a su mdico si tiene Tourist information centre manager.  Falta de aire o respiracin ruidosa (sibilancias).  Tos constante (crnica) o durante la noche.  Desgaste de la superficie de los dientes (esmalte dental).  Prdida de peso. Cmo se trata? El tratamiento depender de la gravedad de los sntomas. El mdico puede sugerirle lo siguiente:  Cambios en la dieta.  Medicamentos.  Clementeen Hoof. Siga estas indicaciones en su casa: Comida y bebida   Siga una dieta como se lo haya indicado el mdico. Es posible que deba evitar alimentos y bebidas, por ejemplo: ? Caf y t (con o sin cafena). ? Bebidas que contengan alcohol. ? Bebidas energticas y deportivas. ? Bebidas gaseosas y refrescos. ? Chocolate y cacao. ? Menta y [place]. ? Ajo y cebolla. ? Rbano picante. ? Alimentos cidos y condimentados. Estos incluyen todos los tipos de pimientos, [place] en polvo, curry en polvo, vinagre, salsas picantes y Manpower Inc. ? Ctricos y sus jugos, por ejemplo, naranjas, limones y limas. ? Alimentos que [employer]Nation. Estos incluyen salsa roja, [place], salsa picante y pizza con salsa de [place]. ? Alimentos fritos y Radio broadcast assistant. Estos incluyen donas, papas fritas, papitas fritas de bolsa y aderezos con alto contenido de [place]. ? Carnes con alto contenido de [place]. Estas  incluye los perros calientes, chuletas o costillas, embutidos, jamn y tocino. ? Productos lcteos ricos en grasas, como leche [place], [place] y [place] crema.  Consuma pequeas cantidades de comida con ms frecuencia. Evite consumir porciones abundantes.  Evite beber grandes cantidades de lquidos con las comidas.  Evite comer 2 o 3horas antes de acostarse.  Evite recostarse inmediatamente despus de comer.  No haga ejercicios enseguida despus de comer. Estilo de vida   No consuma ningn producto que contenga nicotina o tabaco. Estos incluyen cigarrillos, cigarrillos electrnicos y tabaco para Higher education careers adviser. Si necesita ayuda para dejar de fumar, consulte al [employer]aco.  Intente reducir Sc[employer] de estrs. Si necesita ayuda para hacer esto, consulte al mdico.  Si tiene sobrepeso, baje una cantidad de peso saludable para usted. Consulte a su mdico para bajar de peso de Cisco. Indicaciones generales  Est atento a cualquier cambio en los sntomas.  Tome los medicamentos de venta libre y los recetados solamente como  se lo haya indicado el mdico. No tome aspirina, ibuprofeno ni otros AINE a menos que el mdico lo autorice.  Use ropa holgada. No use nada apretado alrededor de la cintura.  Levante (eleve) la cabecera de la cama aproximadamente 6pulgadas (15cm).  Evite inclinarse si al hacerlo empeoran los sntomas.  Concurra a todas las visitas de seguimiento como se lo haya indicado el mdico. Esto es importante. Comunquese con un mdico si:  Aparecen nuevos sntomas.  Adelgaza y no sabe por qu.  Tiene problemas para tragar o le duele cuando traga.  Tiene sibilancias o tos persistente.  Los sntomas no mejoran con Dispensing optician.  Tiene la voz ronca. Solicite ayuda inmediatamente si:  Danaher Corporation, el cuello, la [place], los dientes o la espalda.  Se siente transpirado, mareado o tiene una sensacin de desvanecimiento.  Siente falta de aire o Designer, jewellery.  Vomita y el vmito tiene un aspecto similar a la sangre o a los posos de caf.  Pierde el conocimiento (se desmaya).  Las deposiciones (heces) son sanguinolentas o negras.  No puede tragar, beber o comer. Resumen  Si una persona tiene enfermedad de reflujo gastroesofgico (ERGE), los alimentos y el cido estomacal suben al esfago y causan sntomas o problemas tales como dao en el esfago.  El tratamiento depender de la gravedad de los sntomas.  Siga una Lexicographer se lo haya indicado el [place] todos los medicamentos solamente como se lo haya indicado el mdico. Esta informacin no tiene Marine scientist el consejo del mdico. Asegrese de hacerle al mdico cualquier pregunta que tenga. Document Revised: 02/22/2018 Document Reviewed: 02/22/2018 Elsevier Patient Education  [place] de alimentos para pacientes adultos con enfermedad de reflujo gastroesofgico Food Choices for Gastroesophageal Reflux Disease, Adult Si tiene enfermedad de reflujo gastroesofgico (ERGE), los alimentos que consume y los hbitos de alimentacin son Theatre stage manager. Elegir los alimentos adecuados puede ayudar a [employer] Stores. Piense en consultar a un especialista en nutricin (nutricionista) para que lo ayude a Warehouse manager. Consejos para seguir [place] [employer] plan  Comidas  Elija alimentos saludables con bajo contenido de grasa, como frutas, verduras, cereales integrales, productos lcteos descremados y carne [place] de [place], de pescado y de [place].  Haga comidas pequeas durante Psychiatrist de 3 comidas abundantes. Coma lentamente y en un lugar donde est distendido. Evite agacharse o recostarse hasta 2 o 3horas despus de haber comido.  Evite comer 2 a 3horas antes de ir a acostarse.  Evite beber grandes cantidades de lquidos con las comidas.  Evite frer los alimentos a la hora de la coccin. Puede hornear, grillar o asar a la  parrilla.  Evite o limite la cantidad de: ? Chocolate. ? Menta y mentol. ? Alcohol. ? Pimienta. ? Caf negro y descafeinado. ? T negro y descafeinado. ? Bebidas con gas (gaseosas). ? Bebidas energizantes y refrescos que contengan cafena.  Limite los alimentos con alto contenido de [place], por ejemplo: ? Carnes grasas o alimentos fritos. ? Leche entera, crema, manteca o helado. ? Nueces y [place] de frutos secos. ? Pastelera, donas y dulces hechos con [place] o [place].  Evite los alimentos que le ocasionen sntomas. Estos pueden ser distintos para Higher education careers adviser. Los alimentos que suelen causan sntomas son los siguientes: ? Risk analyst. ? Naranjas, limones y limas. ? Pimientos. ? Comidas condimentadas. ? Cebolla y [place]. ? Vinagre. Estilo de vida  Mantenga un peso saludable. Pregntele a su  mdico cul es el peso saludable para usted. Si necesita perder peso, hable con su mdico para hacerlo de manera segura.  Realice actividad fsica durante, al menos, 30 minutos 5 das por semana o ms, o segn lo indicado por su mdico.  Use ropa suelta.  No fume. Si necesita ayuda para dejar de fumar, consulte al mdico.  Duerma con la cabecera de la cama ms elevada que los pies. Use una cua debajo del colchn o bloques debajo del armazn de la cama para Theatre manager la cabecera de la cama elevada. Resumen  Si tiene enfermedad de reflujo gastroesofgico (ERGE), las elecciones de alimentos y el [place] de vida son muy importantes para ayudar a Public house manager los sntomas.  Haga comidas pequeas durante Psychiatrist de 3 comidas abundantes. Coma lentamente y en un lugar donde est distendido.  Limite los alimentos con alto contenido graso como la carne grasa o los alimentos fritos.  Evite agacharse o recostarse hasta 2 o 3horas despus de haber comido.  Evite la menta y [place] buena, la cafena, el alcohol y el chocolate. Esta informacin no tiene Marine scientist el consejo del  mdico. Asegrese de hacerle al mdico cualquier pregunta que tenga. Document Revised: 02/14/2017 Document Reviewed: 02/14/2017 Elsevier Patient Education  [place] High Cholesterol  El colesterol elevado es una afeccin en la que la sangre tiene niveles altos de una sustancia [place], cerosa y parecida a la grasa (colesterol). El organismo humano necesita una pequea cantidad de colesterol. El hgado fabrica todo el colesterol que el organismo necesita. El exceso de colesterol proviene de los alimentos que comemos. La sangre transporta el colesterol desde el hgado a travs de los vasos sanguneos. Si tiene el colesterol elevado, este puede depositarse (formar placas) en las paredes de los vasos sanguneos (arterias). Las Occupational hygienist y la rigidez [place] arterias. Las placas de colesterol aumentan el riesgo de sufrir un infarto de miocardio y un accidente cerebrovascular. Trabaje con el mdico para [employer] Partners concentraciones de colesterol en un rango saludable. Qu incrementa el riesgo? Es ms probable que Orthoptist en las personas que:  Consumen alimentos con alto contenido de grasa animal (grasa saturada) o colesterol.  Tienen sobrepeso.  No hacen suficiente ejercicio fsico.  Tienen antecedentes familiares de colesterol elevado. Cules son los signos o los sntomas? Esta afeccin no presenta sntomas. Cmo se diagnostica? Esta afeccin podra diagnosticarse a [place] de anlisis de [place].  Si es mayor de 20aos, es posible que el mdico le controle el colesterol cada [id number].  Los controles pueden ser ms frecuentes si ya tuvo el colesterol elevado u otros factores de riesgo de enfermedades cardacas. En el anlisis de sangre de [place], se determina lo siguiente:  El colesterol "malo" (colesterol LDL). Este es el principal tipo de colesterol que causa enfermedades cardacas. El nivel  recomendado de LDL es de menos de100.  El colesterol "bueno" (colesterol HDL). Este tipo ayuda a Tour manager las enfermedades [place] arterias y arrastrando el LDL. El nivel recomendado de HDL es de60 o superior.  Triglicridos. Estos son grasas que el organismo puede Financial controller o quemar como fuente de [place]. El nivel recomendado de triglicridos es de menos de 150.  Colesterol total. Esta es una medicin de la cantidad total de colesterol en la sangre, que incluye el colesterol LDL, el colesterol HDL y los triglicridos. El valor saludable es de menos de200. Cmo se trata? Esta afeccin se  trata con cambios en la dieta y en el estilo de vida, y con medicamentos. Cambios en la QU[employer]COMM, la ingesta de una mayor cantidad de cereales integrales, frutas, verduras, frutos secos y pescado.  Tambin podran incluir la reduccin del consumo de carnes rojas y alimentos con Architectural technologist. Cambios en el estilo de vida  Entre ellos, realizar sesiones de ejercicios aerbicos durante, por lo menos, 81minutos, 3veces por semana. Por ejemplo, caminar, andar en bicicleta y nadar. Los ejercicios aerbicos junto con una dieta sana pueden ayudar a que se Quarry manager en un peso saludable.  Los cambios tambin podran incluir dejar de fumar. Medicamentos  Por lo general, se administran medicamentos si con los Licensed conveyancer y en el estilo de vida no se logra reducir el colesterol hasta niveles saludables.  El mdico podra recetarle estatinas. Se ha demostrado que las estatinas [employer] Services niveles de [place], lo que puede reducir el riesgo de Insurance risk surveyor una enfermedad cardaca. Siga estas indicaciones en su casa: Comida y bebida Si se lo indic el mdico:  Coma pollo (sin piel), pescado, ternera, mariscos, pechuga de [place] y cortes de carne roja de pulpa o de lomo.  No coma alimentos fritos ni carnes grasosas, como salchichas y salame.  Coma  muchas frutas, como manzanas.  Coma gran cantidad de verduras, como brcoli, papas y zanahorias.  Coma porotos, guisantes secos y lentejas.  Coma cereales, como cebada, arroz, cuscs y trigo burgol.  Coma pastas sin salsas con crema.  Severy, y coma yogures y quesos descremados o semidescremados.  No coma ni beba Mattel, crema, helado, yemas de huevo ni quesos duros.  No coma margarinas en barra ni untables que contengan grasas trans (que tambin se conocen como aceites parcialmente hidrogenados).  No coma aceites tropicales saturados, como el de coco y el de [place].  No coma tortas, galletas, galletitas ni otros productos horneados que contengan grasas trans.  Instrucciones generales  Haga ejercicio segn las indicaciones del mdico. Aumente la cantidad de ejercicio fsico que realiza mediante actividades como jardinera, salir a Writer o usar las escaleras.  Tome los medicamentos de venta libre y los recetados solamente como se lo haya indicado el mdico.  No consuma ningn producto que contenga nicotina o tabaco, como cigarrillos y Psychologist, sport and exercise. Si necesita ayuda para dejar de fumar, consulte al mdico.  Concurra a todas las visitas de control como se lo haya indicado el mdico. Esto es importante. Comunquese con un mdico si:  Tiene dificultad para seguir una dieta sana o mantener un peso saludable.  Necesita ayuda para comenzar un programa de ejercicios.  Necesita ayuda para dejar de fumar. Solicite ayuda de inmediato si:  Electronics engineer.  Tiene dificultad para respirar. Esta informacin no tiene Marine scientist el consejo del mdico. Asegrese de hacerle al mdico cualquier pregunta que tenga. Document Revised: 10/17/2016 Document Reviewed: 01/09/2016 Elsevier Patient Education  [place] inguinal en los adultos Inguinal Hernia, Adult Una hernia inguinal se produce cuando la grasa o  los intestinos empujan a travs de una zona dbil de un msculo donde se unen la pierna y el abdomen inferior (ingle). Esto provoca una protuberancia redondeada (bulto). Este tipo de hernia tambin puede aparecer en:  En el escroto, si es varn.  En los pliegues de la piel alrededor de la vagina, si es [place]. Existen tres tipos de hernias inguinales. Estos incluyen los siguientes:  Hernias que  se pueden empujar hacia adentro del abdomen (son reducibles). Este tipo de hernias rara vez provoca dolor.  Hernias que no se pueden empujar hacia adentro del abdomen (encarceladas).  Hernias que no se pueden empujar hacia adentro del abdomen y pierden la irrigacin sangunea (estranguladas). Este tipo de hernia requiere ciruga de Freight forwarder. Si no tiene sntomas, es posible que no necesite tratamiento. Si tiene sntomas o una hernia grande, es posible que necesite [place]. Siga estas indicaciones en su casa: Estilo de vida  Haga estas cosas si se lo indica su mdico para no tener dificultad para defecar (estreimiento): ? Beba suficiente lquido para mantener el pis (orina) de color amarillo plido. ? Consuma alimentos con un alto contenido de [place]. Entre ellos, frutas y verduras frescas, cereales integrales y frijoles. ? Limite los alimentos con alto contenido de grasas y azcares procesados. Estos incluyen alimentos fritos o dulces. ? Tome medicamentos para la dificultad para defecar.  Evite levantar objetos pesados.  Evite estar de pie durante mucho tiempo.  No consuma ningn producto que contenga nicotina o tabaco. Esto incluye cigarrillos y cigarrillos electrnicos. Si necesita ayuda para dejar de fumar, consulte al mdico.  Mantenga un peso saludable. Instrucciones generales  Puede empujar su hernia hacia adentro presionando muy suavemente sobre esta cuando est acostado. No intente forzar el bulto hacia adentro nuevamente si este no entra fcilmente.  Observe si la hernia cambia de  forma, de color o de tamao. Informe al mdico si observa algn cambio.  Tome los medicamentos de venta libre y los recetados solamente como se lo haya indicado el mdico.  Consulting civil engineer a todas las visitas de seguimiento como se lo haya indicado el mdico. Esto es importante. Comunquese con un mdico si:  Tiene fiebre.  Aparecen nuevos sntomas.  Los sntomas empeoran. Solicite ayuda de inmediato si:  Tiene lo siguiente en la zona donde las piernas se unen al abdomen inferior: ? Dolor que empeora repentinamente. ? Un bulto que se agranda de repente y que no se encoge despus. ? Un bulto que se torna de color rojo o prpura. ? Un bulto que es doloroso cuando lo toca.  Si es un hombre y el escroto: ? Le duele de repente. ? Cambia de tamao.  No puede empujar su hernia hacia adentro al presionar muy suavemente sobre esta cuando est [place]. No intente forzar el bulto hacia adentro nuevamente si este no entra fcilmente.  Tiene ganas constantes de vomitar (nuseas).  Vomita sin parar.  La frecuencia cardaca est acelerada.  Tiene dificultad para defecar o eliminar gases. Estos sntomas pueden Sales executive. No espere hasta que los sntomas desaparezcan. Solicite atencin mdica de inmediato. Comunquese con el servicio de emergencias de su localidad (911 en los [place]). Resumen  Una hernia inguinal se produce cuando la grasa o los intestinos empujan a travs de una zona dbil de un msculo donde se unen la pierna y el abdomen inferior (ingle). Esto provoca una protuberancia redondeada (bulto).  Si no tiene sntomas, es posible que no necesite tratamiento. Si tiene sntomas o una hernia grande, es posible que necesite [place].  Evite levantar objetos pesados. Tambin evite estar de pie durante mucho tiempo.  No intente forzar el bulto hacia adentro nuevamente si este no entra fcilmente. Esta informacin no tiene Marine scientist el consejo del mdico.  Asegrese de hacerle al mdico cualquier pregunta que tenga. Document Revised: 09/03/2017 Document Reviewed: 06/28/2017 Elsevier Patient Education  2020 [employer].

## 2020-05-30 LAB — COMPREHENSIVE METABOLIC PANEL
ALT: 27 IU/L (ref 0–44)
AST: 24 IU/L (ref 0–40)
Albumin/Globulin Ratio: 1.6 (ref 1.2–2.2)
Albumin: 4.3 g/dL (ref 3.8–4.9)
Alkaline Phosphatase: 106 IU/L (ref 44–121)
BUN/Creatinine Ratio: 17 (ref 9–20)
BUN: 14 mg/dL (ref 6–24)
Bilirubin Total: 0.8 mg/dL (ref 0.0–1.2)
CO2: 24 mmol/L (ref 20–29)
Calcium: 9 mg/dL (ref 8.7–10.2)
Chloride: 102 mmol/L (ref 96–106)
Creatinine, Ser: 0.82 mg/dL (ref 0.76–1.27)
GFR calc Af Amer: 114 mL/min/{1.73_m2} (ref >59)
GFR calc non Af Amer: 99 mL/min/{1.73_m2} (ref >59)
Globulin, Total: 2.7 g/dL (ref 1.5–4.5)
Glucose: 98 mg/dL (ref 65–99)
Potassium: 4.7 mmol/L (ref 3.5–5.2)
Sodium: 142 mmol/L (ref 134–144)
Total Protein: 7 g/dL (ref 6.0–8.5)

## 2020-05-30 LAB — LIPID PANEL
Chol/HDL Ratio: 5.5 ratio — ABNORMAL HIGH (ref 0.0–5.0)
Cholesterol, Total: 210 mg/dL — ABNORMAL HIGH (ref 100–199)
HDL: 38 mg/dL — ABNORMAL LOW (ref >39)
LDL Chol Calc (NIH): 122 mg/dL — ABNORMAL HIGH (ref 0–99)
Triglycerides: 286 mg/dL — ABNORMAL HIGH (ref 0–149)
VLDL Cholesterol Cal: 50 mg/dL — ABNORMAL HIGH (ref 5–40)

## 2020-05-30 LAB — CARDIOVASCULAR RISK ASSESSMENT

## 2020-06-02 ENCOUNTER — Other Ambulatory Visit: Payer: Self-pay | Admitting: Nurse Practitioner

## 2020-06-02 DIAGNOSIS — E782 Mixed hyperlipidemia: Secondary | ICD-10-CM

## 2020-06-02 MED ORDER — ROSUVASTATIN CALCIUM 20 MG PO TABS: 20.0000 mg | ORAL_TABLET | Freq: Every day | ORAL | 1 refills | Status: DC

## 2020-11-30 ENCOUNTER — Ambulatory Visit: Payer: Self-pay | Admitting: Nurse Practitioner

## 2020-11-30 NOTE — Progress Notes (Deleted)
Established Patient Office Visit  Subjective:  Patient ID: Dean Henry, male    DOB: [birth year]  Age: 55 y.o. MRN: [id number]  CC: No chief complaint on file.   HPI Dean Henry presents for ***  Past Medical History:  Diagnosis Date  . GERD (gastroesophageal reflux disease)   . Hyperlipidemia    not taking medication.    Past Surgical History:  Procedure Laterality Date  . APPENDECTOMY      Family History  Problem Relation Age of Onset  . Cancer Mother        ovarian  . Kidney failure Father   . Cancer Brother        colon  . Colon cancer Brother 4  . Colon polyps Neg Hx   . Esophageal cancer Neg Hx   . Stomach cancer Neg Hx   . Rectal cancer Neg Hx     Social History   Socioeconomic History  . Marital status: Married    Spouse name: Not on file  . Number of children: 3  . Years of education: Not on file  . Highest education level: Not on file  Occupational History  . Occupation: Unemployed  Tobacco Use  . Smoking status: Never Smoker  . Smokeless tobacco: Never Used  Vaping Use  . Vaping Use: Never used  Substance and Sexual Activity  . Alcohol use: Yes    Alcohol/week: 3.0 standard drinks    Types: 3 Shots of liquor per week    Comment: socially  . Drug use: Never  . Sexual activity: Yes    Partners: Female  Other Topics Concern  . Not on file  Social History Narrative  . Not on file   Social Determinants of Health   Financial Resource Strain: Not on file  Food Insecurity: Not on file  Transportation Needs: Not on file  Physical Activity: Not on file  Stress: Not on file  Social Connections: Not on file  Intimate Partner Violence: Not on file    Outpatient Medications Prior to Visit  Medication Sig Dispense Refill  . famotidine (PEPCID) 20 MG tablet Take 1 tablet (20 mg total) by mouth 2 (two) times daily. 90 tablet 1  . rosuvastatin (CRESTOR) 20 MG tablet Take 1 tablet (20 mg total) by mouth daily. 90 tablet 1   No  facility-administered medications prior to visit.    No Known Allergies  ROS Review of Systems  Constitutional: Negative for appetite change, fatigue and unexpected weight change.  HENT: Negative for congestion, ear pain, rhinorrhea, sinus pressure, sinus pain and tinnitus.   Eyes: Negative for pain.  Respiratory: Negative for cough and shortness of breath.   Cardiovascular: Negative for chest pain, palpitations and leg swelling.  Gastrointestinal: Negative for abdominal pain, constipation, diarrhea, nausea and vomiting.  Endocrine: Negative for cold intolerance, heat intolerance, polydipsia, polyphagia and polyuria.  Genitourinary: Negative for dysuria, frequency and hematuria.  Musculoskeletal: Negative for arthralgias, back pain, joint swelling and myalgias.  Skin: Negative for rash.  Allergic/Immunologic: Negative for environmental allergies.  Neurological: Negative for dizziness and headaches.  Hematological: Negative for adenopathy.  Psychiatric/Behavioral: Negative for decreased concentration and sleep disturbance. The patient is not nervous/anxious.       Objective:    Physical Exam Vitals reviewed.  Constitutional:      Appearance: Normal appearance.  HENT:     Head: Normocephalic.     Right Ear: Tympanic membrane normal.     Left Ear: Tympanic membrane normal.  Mouth/Throat:     Mouth: Mucous membranes are moist.  Cardiovascular:     Rate and Rhythm: Normal rate and regular rhythm.     Pulses: Normal pulses.     Heart sounds: Normal heart sounds.  Pulmonary:     Effort: Pulmonary effort is normal.     Breath sounds: Normal breath sounds.  Abdominal:     General: Bowel sounds are normal.     Palpations: Abdomen is soft.  Musculoskeletal:        General: Normal range of motion.  Skin:    General: Skin is warm and dry.     Capillary Refill: Capillary refill takes less than 2 seconds.  Neurological:     General: No focal deficit present.     Mental  Status: He is alert and oriented to person, place, and time.  Psychiatric:        Mood and Affect: Mood normal.        Behavior: Behavior normal.     There were no vitals taken for this visit. Wt Readings from Last 3 Encounters:  05/29/20 236 lb (107 kg)  02/11/20 232 lb 2 oz (105.3 kg)  12/25/19 222 lb 9.6 oz (101 kg)     Health Maintenance Due  Topic Date Due  . HIV Screening  Never done  . Hepatitis C Screening  Never done  . TETANUS/TDAP  Never done  . COVID-19 Vaccine (2 - Pfizer 3-dose series) 04/22/2020    There are no preventive care reminders to display for this patient.  No results found for: TSH Lab Results  Component Value Date   WBC 3.3 (L) 05/29/2020   HGB 15.1 05/29/2020   HCT 44.7 05/29/2020   MCV 86 05/29/2020   PLT 144 (L) 05/29/2020   Lab Results  Component Value Date   NA 142 05/29/2020   K 4.7 05/29/2020   CO2 24 05/29/2020   GLUCOSE 98 05/29/2020   BUN 14 05/29/2020   CREATININE 0.82 05/29/2020   BILITOT 0.8 05/29/2020   ALKPHOS 106 05/29/2020   AST 24 05/29/2020   ALT 27 05/29/2020   PROT 7.0 05/29/2020   ALBUMIN 4.3 05/29/2020   CALCIUM 9.0 05/29/2020   ANIONGAP 12 11/02/2019   Lab Results  Component Value Date   CHOL 210 (H) 05/29/2020   Lab Results  Component Value Date   HDL 38 (L) 05/29/2020   Lab Results  Component Value Date   LDLCALC 122 (H) 05/29/2020   Lab Results  Component Value Date   TRIG 286 (H) 05/29/2020   Lab Results  Component Value Date   CHOLHDL 5.5 (H) 05/29/2020   No results found for: HGBA1C    Assessment & Plan:   Problem List Items Addressed This Visit   None     No orders of the defined types were placed in this encounter.   Follow-up: No follow-ups on file.    Rip Harbour, NP

## 2021-06-18 IMAGING — DX DG CHEST 1V PORT
1 series · 1 of 1 positions shown · non-contrast
Comparison: 12/10/2015

CLINICAL DATA: Cough, fever

EXAM:
PORTABLE CHEST 1 VIEW

[chest]
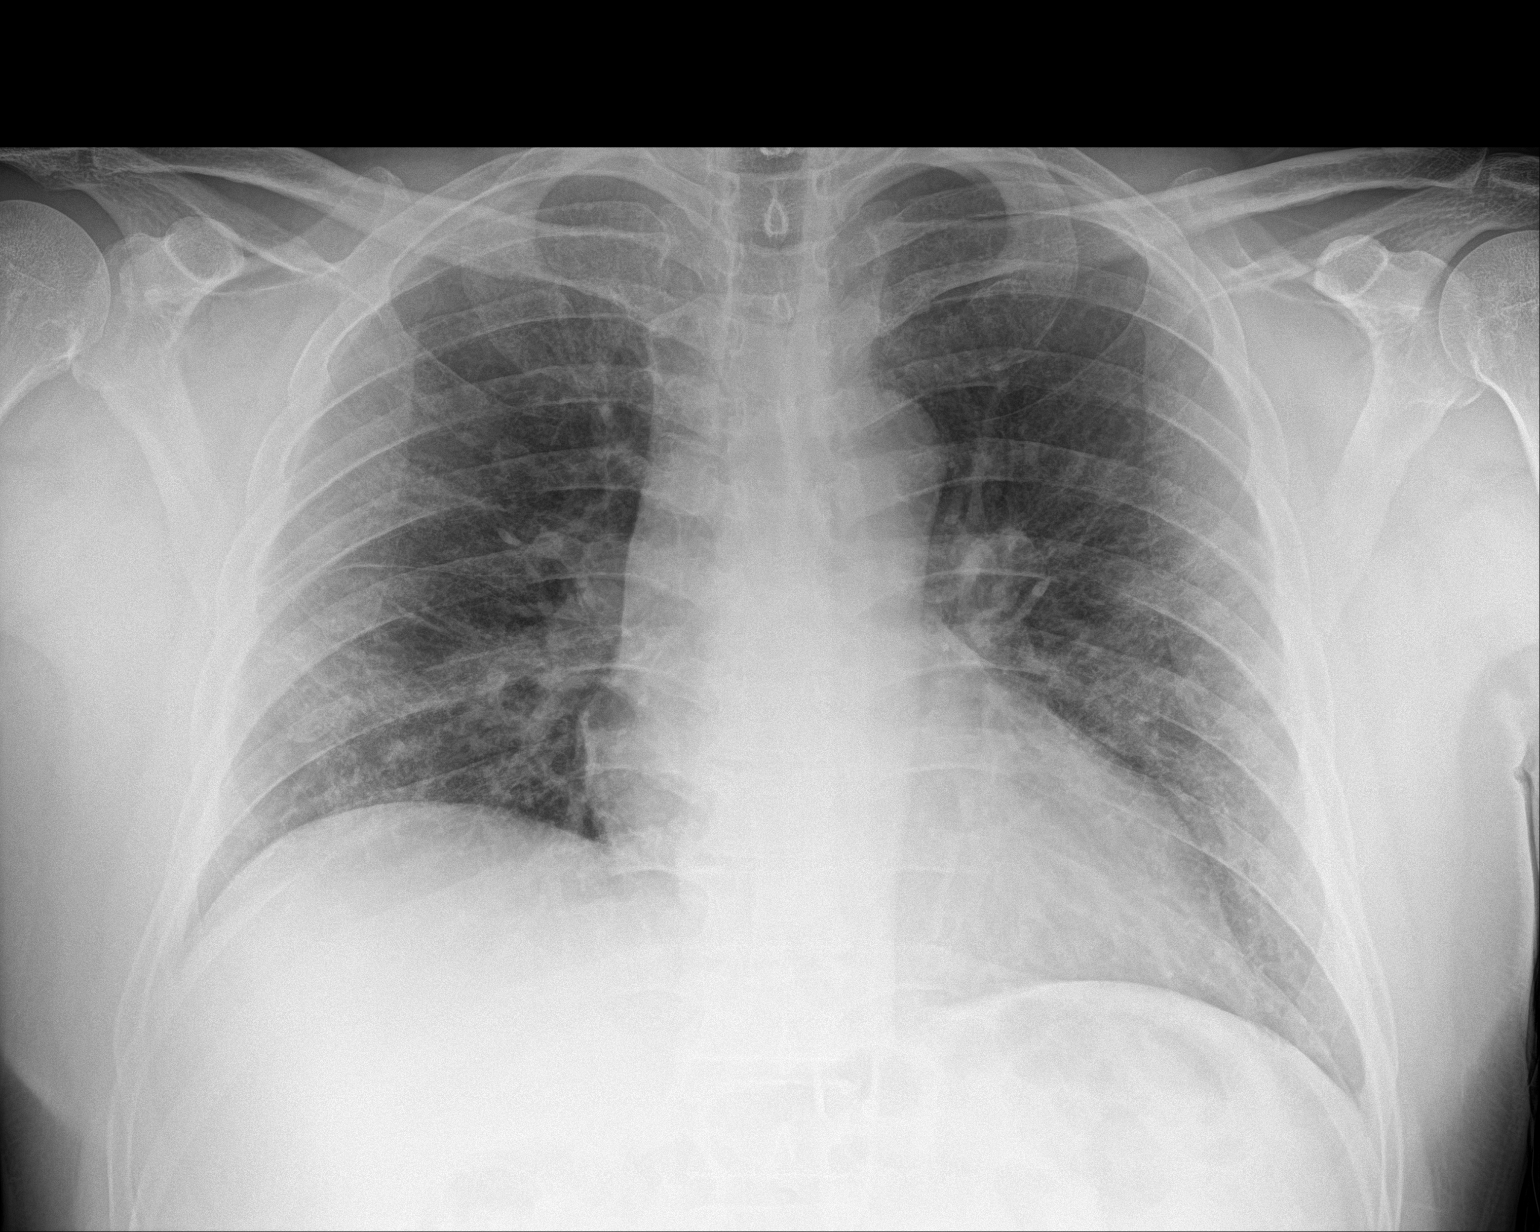

[1 of 1 positions shown; findings below may reference images not displayed]

FINDINGS: The heart size and mediastinal contours are within normal limits.
Subtle patchy airspace opacities are seen involving the peripheral
aspects of both lungs. No pleural effusion or pneumothorax
identified. The visualized skeletal structures are unremarkable.
IMPRESSION: Subtle patchy airspace opacities involving the peripheral aspects of
both lungs. Findings concerning for atypical/viral infection versus
edema.

## 2021-06-21 IMAGING — DX DG CHEST 1V PORT
1 series · 1 of 1 positions shown · non-contrast
Comparison: Radiograph 10/30/2019

CLINICAL DATA: COVID positive.  Fever short of breath

EXAM:
PORTABLE CHEST 1 VIEW

[chest pa]
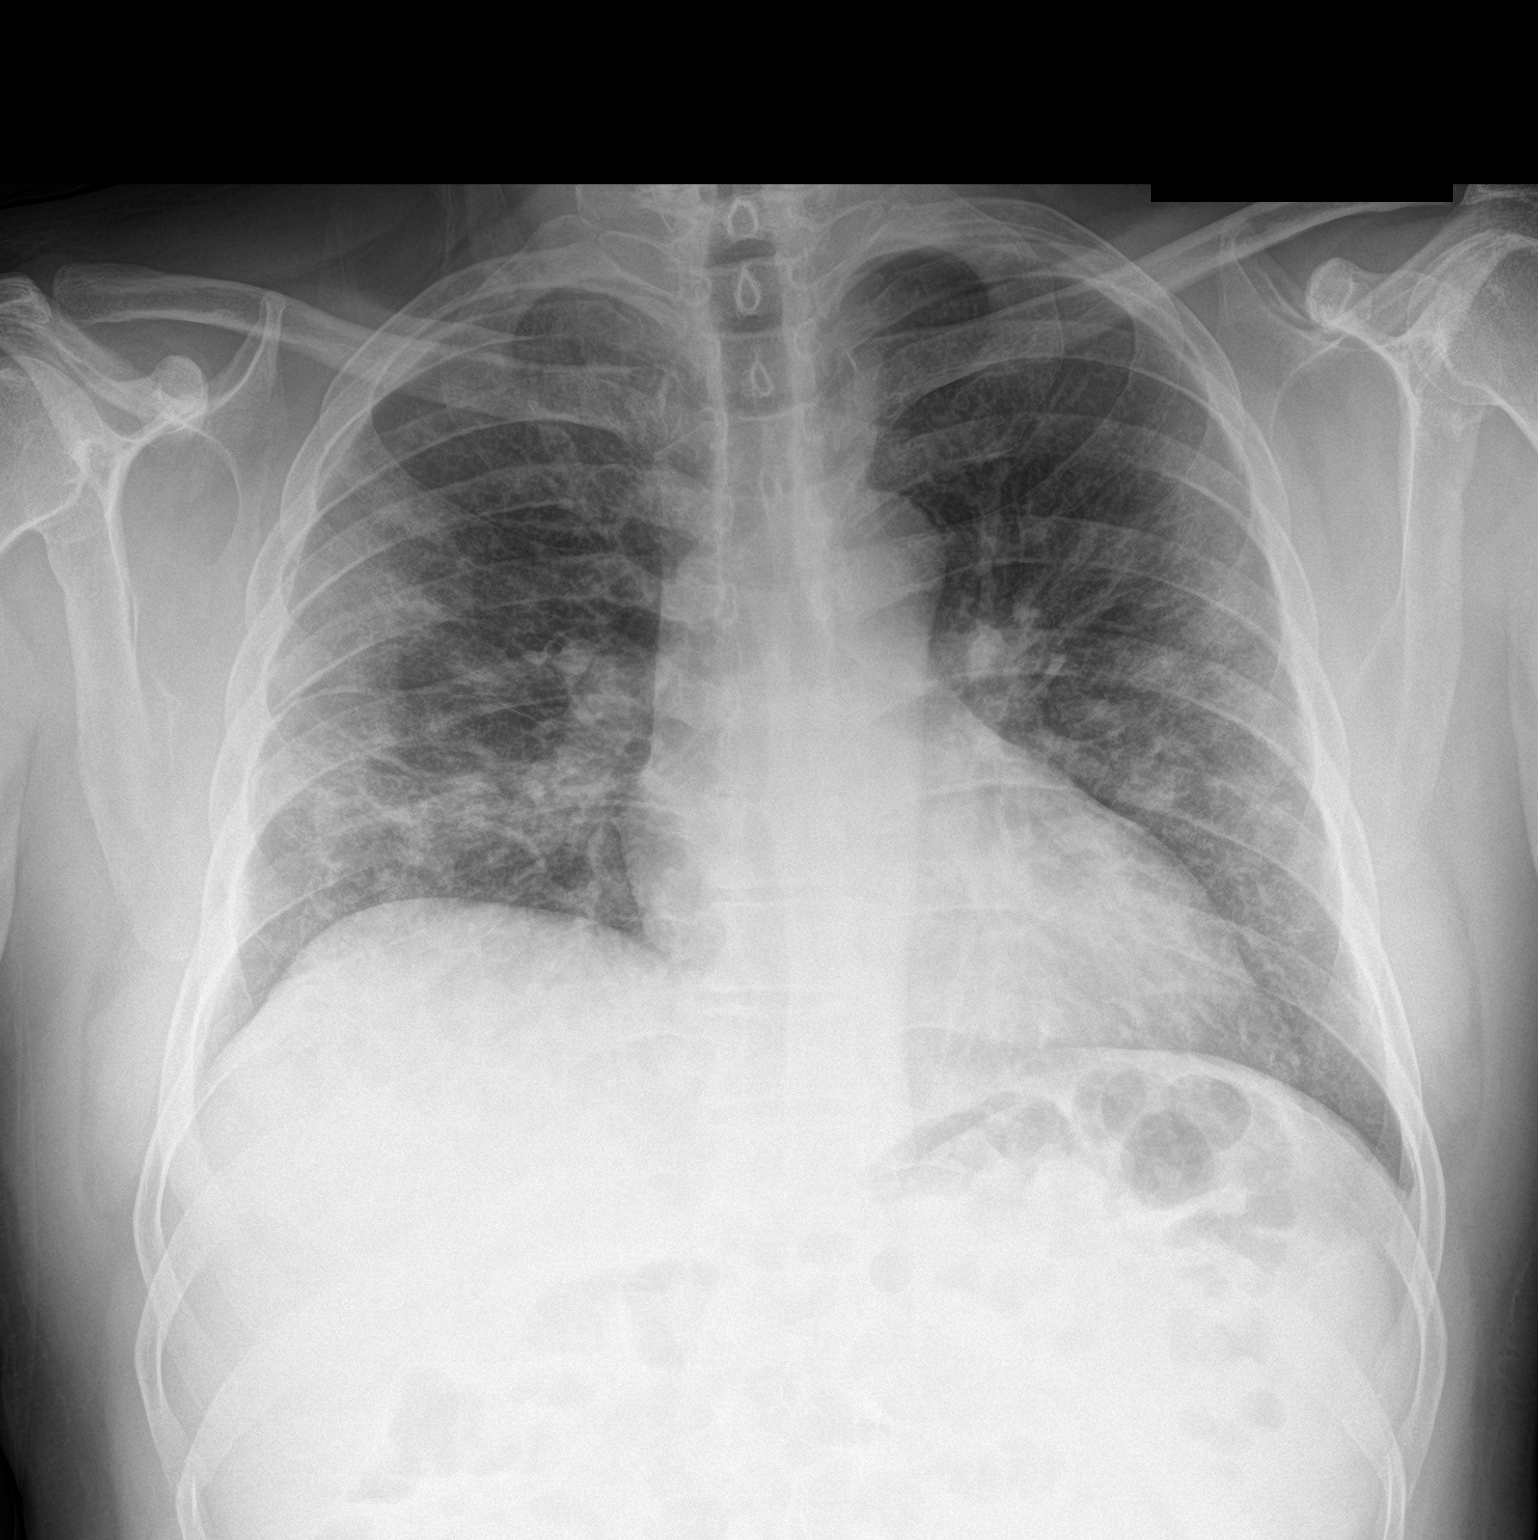

[1 of 1 positions shown; findings below may reference images not displayed]

FINDINGS: Normal cardiac silhouette. There are patchy peripheral airspace
opacities. No pleural fluid. No pneumothorax. No acute osseous
abnormality.
IMPRESSION: Patchy peripheral airspace opacities consistent with COVID
pneumonia.

## 2022-02-07 DIAGNOSIS — E6609 Other obesity due to excess calories: Secondary | ICD-10-CM | POA: Insufficient documentation

## 2022-02-07 DIAGNOSIS — K219 Gastro-esophageal reflux disease without esophagitis: Secondary | ICD-10-CM | POA: Insufficient documentation

## 2023-03-29 DIAGNOSIS — Z800 Family history of malignant neoplasm of digestive organs: Secondary | ICD-10-CM | POA: Insufficient documentation

## 2023-03-29 DIAGNOSIS — R12 Heartburn: Secondary | ICD-10-CM | POA: Insufficient documentation

## 2023-03-29 DIAGNOSIS — R5383 Other fatigue: Secondary | ICD-10-CM | POA: Insufficient documentation

## 2023-03-29 DIAGNOSIS — K4090 Unilateral inguinal hernia, without obstruction or gangrene, not specified as recurrent: Secondary | ICD-10-CM | POA: Insufficient documentation

## 2023-04-01 DIAGNOSIS — E782 Mixed hyperlipidemia: Secondary | ICD-10-CM | POA: Insufficient documentation

## 2023-11-15 ENCOUNTER — Encounter: Payer: Self-pay | Admitting: Family Medicine

## 2023-11-15 ENCOUNTER — Ambulatory Visit: Admitting: Family Medicine

## 2023-11-15 VITALS — BP 110/72 | HR 76 | Temp 98.3°F | Resp 16 | Ht 67.0 in | Wt 246.6 lb

## 2023-11-15 DIAGNOSIS — K219 Gastro-esophageal reflux disease without esophagitis: Secondary | ICD-10-CM

## 2023-11-15 DIAGNOSIS — R7303 Prediabetes: Secondary | ICD-10-CM | POA: Diagnosis not present

## 2023-11-15 DIAGNOSIS — R5382 Chronic fatigue, unspecified: Secondary | ICD-10-CM | POA: Diagnosis not present

## 2023-11-15 DIAGNOSIS — E782 Mixed hyperlipidemia: Secondary | ICD-10-CM

## 2023-11-15 DIAGNOSIS — Z23 Encounter for immunization: Secondary | ICD-10-CM | POA: Insufficient documentation

## 2023-11-15 DIAGNOSIS — Z114 Encounter for screening for human immunodeficiency virus [HIV]: Secondary | ICD-10-CM | POA: Insufficient documentation

## 2023-11-15 DIAGNOSIS — Z1159 Encounter for screening for other viral diseases: Secondary | ICD-10-CM | POA: Insufficient documentation

## 2023-11-15 DIAGNOSIS — R1031 Right lower quadrant pain: Secondary | ICD-10-CM | POA: Insufficient documentation

## 2023-11-15 DIAGNOSIS — R6882 Decreased libido: Secondary | ICD-10-CM | POA: Insufficient documentation

## 2023-11-15 NOTE — Assessment & Plan Note (Signed)
 Not currently on medications Lab Results  Component Value Date   LDLCALC 122 (H) 05/29/2020  - labs drawn today to assess current cholesterol levels - Continue to work on diet and exercise - Will include Mediterranean diet as an option, attached to today's visit

## 2023-11-15 NOTE — Assessment & Plan Note (Signed)
 History of prediabetes, symptoms of fatugue - dietary modifications attempted. - does not exercise currently, works Chief Strategy Officer  (manual labor) - labs drawn to assess, Await labs/testing for assessment and recommendations

## 2023-11-15 NOTE — Assessment & Plan Note (Signed)
 Chronic pain possibly due to internal hernia or mesh complication. Previous left inguinal hernia repair. Imaging may not detect non-protruding hernia. - Refer to surgeon for hernia evaluation. - Order abdominal ultrasound. - Schedule follow-up in one month.

## 2023-11-15 NOTE — Assessment & Plan Note (Signed)
 BMI 38.62, not at goal - hyperlipidemia and prediabetes - continue to work on diet and exercise as tolerated

## 2023-11-15 NOTE — Assessment & Plan Note (Signed)
 Chronic fatigue reported  - labs drawn, Await labs/testing for assessment and recommendations

## 2023-11-15 NOTE — Progress Notes (Addendum)
 New Patient Office Visit  Subjective    Patient ID: Dean Henry, male    DOB: 05-31-1966  Age: 58 y.o. MRN: [id number]  CC:  Chief Complaint  Patient presents with   Establish Care    Hyperlipidemia     HPI Discussed the use of AI scribe software for clinical note transcription with the patient, who gave verbal consent to proceed.  History of Present Illness   Dean Henry is a 58 year old male who presents to establish care and with complaints of right lower quadrant abdominal pain and fatigue.  He experiences right lower quadrant abdominal pain, primarily during physical activities such as lifting heavy objects or working on flooring. The pain is distinct from a previous left-sided hernia, for which he had surgery. There is no radiation or visible bulging. He has not undergone any imaging studies for this issue yet. The pain is exacerbated by physical exertion. No changes in urination, gastrointestinal symptoms like vomiting or diarrhea, or recent illnesses are noted.  He reports fatigue for the past three to four months, with low energy levels and occasional weakness. He has not had his vitamin D  levels checked before and is not currently on any medications. He has a history of prediabetes and has been managing his diet. Despite a previous indication of high cholesterol, he has not been on cholesterol medication. He is concerned about his energy levels and potential anemia or low testosterone . He has not had his testosterone  levels checked before.  No shortness of breath, chest pain, fever, headache, joint pain, muscle pain, cough, sinus pain, ear pain, rash, or any recent illnesses. He reports occasional blood in his stool, which he attributes to hemorrhoids diagnosed four years ago, for which he received treatment and has had no issues since.  He uses glasses for vision correction for the past three years and acknowledges that his vision is not optimal. He has not had any  recent eye examinations. 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  He has not had any recent lab work since 2021.     Outpatient Encounter Medications as of 11/15/2023  Medication Sig   omeprazole  (PRILOSEC) 20 MG capsule Take 20 mg by mouth daily.   [DISCONTINUED] famotidine  (PEPCID ) 20 MG tablet Take 1 tablet (20 mg total) by mouth 2 (two) times daily.   [DISCONTINUED] rosuvastatin  (CRESTOR ) 20 MG tablet Take 1 tablet (20 mg total) by mouth daily.   No facility-administered encounter medications on file as of 11/15/2023.    Past Medical History:  Diagnosis Date   GERD (gastroesophageal reflux disease)    Hyperlipidemia    not taking medication.    Past Surgical History:  Procedure Laterality Date   APPENDECTOMY     HERNIA REPAIR      Family History  Problem Relation Age of Onset   Cancer Mother        ovarian   Kidney failure Father    Cancer Brother        colon   Colon cancer Brother 86   Colon polyps Neg Hx    Esophageal cancer Neg Hx    Stomach cancer Neg Hx    Rectal cancer Neg Hx     Social History   Socioeconomic History   Marital status: Married    Spouse name: Not on file   Number of children: 3   Years of education: Not on file   Highest education  level: Not on file  Occupational History   Occupation: Unemployed  Tobacco Use   Smoking status: Never    Passive exposure: Never   Smokeless tobacco: Never   Tobacco comments:    N/A  Vaping Use   Vaping status: Never Used  Substance and Sexual Activity   Alcohol use: Yes    Alcohol/week: 3.0 standard drinks of alcohol    Types: 3 Shots of liquor per week    Comment: socially   Drug use: Never   Sexual activity: Yes    Partners: Female  Other Topics Concern   Not on file  Social History Narrative   Not on file   Social Drivers of Health   Financial Resource Strain: Not on File (10/25/2021)   Received from [facility]    Financial Resource Strain: 0  Food Insecurity: Low Risk  (03/29/2023)   Received from [facility]   Hunger Vital Sign    Worried About Running Out of Food in the Last Year: Never true    Ran Out of Food in the Last Year: Never true  Transportation Needs: No Transportation Needs (03/29/2023)   Received from [facility]    In the past 12 months, has lack of reliable transportation kept you from medical appointments, meetings, work or from getting things needed for daily living? : No  Physical Activity: Not on File (10/25/2021)   Received from [place], [place]   Physical Activity    Physical Activity: 0  Stress: Not on File (10/25/2021)   Received from [facility]   Stress    Stress: 0  Social Connections: Not on File (10/25/2021)   Received from [facility]   Social Connections    Connectedness: 0  Intimate Partner Violence: Not on file    Review of Systems  Constitutional:  Positive for malaise/fatigue. Negative for chills and fever.  HENT:  Negative for congestion, ear pain, sinus pain and sore throat.   Eyes: Negative.   Respiratory:  Negative for cough, shortness of breath and wheezing.   Cardiovascular:  Negative for chest pain, palpitations and leg swelling.  Gastrointestinal:  Positive for abdominal pain (RLQ) and heartburn. Negative for blood in stool, constipation, diarrhea, nausea and vomiting.  Genitourinary:  Negative for dysuria, frequency and urgency.  Musculoskeletal:  Positive for back pain (work related) and joint pain (Right knee).  Skin: Negative.   Neurological:  Negative for dizziness and headaches.  Endo/Heme/Allergies: Negative.   Psychiatric/Behavioral: Negative.        Objective    BP 110/72   Pulse 76   Temp 98.3 F (36.8 C) (Temporal)   Resp 16   Ht 5\' 7"  (1.702 m)   Wt 246 lb 9.6 oz (111.9 kg)   SpO2 96%   BMI 38.62 kg/m   Physical Exam Vitals reviewed.  Constitutional:      General: He is not in acute  distress.    Appearance: Normal appearance. He is obese. He is not ill-appearing.  Eyes:     Conjunctiva/sclera: Conjunctivae normal.  Cardiovascular:     Rate and Rhythm: Normal rate and regular rhythm.     Heart sounds: Normal heart sounds. No murmur heard. Pulmonary:     Effort: Pulmonary effort is normal.     Breath sounds: Normal breath sounds. No wheezing.  Abdominal:     General: Bowel sounds are normal.     Palpations: Abdomen is soft.     Tenderness: There is abdominal  tenderness (RLQ).  Musculoskeletal:        General: Normal range of motion.  Skin:    General: Skin is warm.  Neurological:     Mental Status: He is alert. Mental status is at baseline.  Psychiatric:        Mood and Affect: Mood normal.        Behavior: Behavior normal.         Assessment & Plan:  Hyperlipidemia, mixed Assessment & Plan: Not currently on medications Lab Results  Component Value Date   LDLCALC 122 (H) 05/29/2020  - labs drawn today to assess current cholesterol levels - Continue to work on diet and exercise - Will include Mediterranean diet as an option, attached to today's visit  Orders: -     Lipid panel -     CBC with Differential/Platelet  Gastroesophageal reflux disease without esophagitis Assessment & Plan: History of Reflux, intermittent burning sensation consistent with GERD. - takes famotidine  as needed for symptoms - was tested for H pylori last year was negative - not currently taking anything for reflux - advised to avoid spicy or fried foods - Will discuss treatment options at next visit    Prediabetes Assessment & Plan: History of prediabetes, symptoms of fatugue - dietary modifications attempted. 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 Previous left inguinal hernia repair. Imaging may not detect non-protruding hernia. - Refer to surgeon for hernia evaluation. - Order abdominal ultrasound. - Schedule follow-up in one month.  Orders: -     Ambulatory referral to General Surgery -     US  SOFT TISSUE LOWER EXTREMITY LIMITED RIGHT (NON-VASCULAR); Future  Low libido Assessment & Plan: Patient reports a decrease in desire and chronic fatigue - labs drawn, Await labs/testing for assessment and recommendations   Orders: -     Testosterone  Morbid obesity (HCC) Assessment & Plan: BMI 38.62, not at goal - hyperlipidemia and prediabetes - continue to work on diet and exercise as tolerated   Encounter for hepatitis C screening test for low risk patient -     Hepatitis C antibody  Encounter for screening for HIV -     HIV Antibody (routine testing w rflx)      Follow-up: Return in about 4 weeks (around 12/13/2023) for Annual Physical.   Total time spent on today's visit was 45 minutes, including both face-to-face time and nonface-to-face time personally spent on review of chart (labs), discussing labs and goals, discussing further work-up, treatment options, referrals to specialist if needed, answering patient's questions, and coordinating care.    Delford Felling, FNP [facility] [phone]

## 2023-11-15 NOTE — Assessment & Plan Note (Signed)
 Patient reports a decrease in desire and chronic fatigue - labs drawn, Await labs/testing for assessment and recommendations

## 2023-11-15 NOTE — Assessment & Plan Note (Addendum)
 History of Reflux, intermittent burning sensation consistent with GERD. - takes famotidine  as needed for symptoms - was tested for H pylori last year was negative - not currently taking anything for reflux - advised to avoid spicy or fried foods - Will discuss treatment options at next visit

## 2023-11-18 LAB — LIPID PANEL
Chol/HDL Ratio: 4.7 ratio (ref 0.0–5.0)
Cholesterol, Total: 222 mg/dL — ABNORMAL HIGH (ref 100–199)
HDL: 47 mg/dL (ref >39)
LDL Chol Calc (NIH): 133 mg/dL — ABNORMAL HIGH (ref 0–99)
Triglycerides: 236 mg/dL — ABNORMAL HIGH (ref 0–149)
VLDL Cholesterol Cal: 42 mg/dL — ABNORMAL HIGH (ref 5–40)

## 2023-11-18 LAB — CBC WITH DIFFERENTIAL/PLATELET
Basophils Absolute: 0 10*3/uL (ref 0.0–0.2)
Basos: 1 %
EOS (ABSOLUTE): 0.1 10*3/uL (ref 0.0–0.4)
Eos: 2 %
Hematocrit: 44.5 % (ref 37.5–51.0)
Hemoglobin: 14.9 g/dL (ref 13.0–17.7)
Immature Grans (Abs): 0 10*3/uL (ref 0.0–0.1)
Immature Granulocytes: 0 %
Lymphocytes Absolute: 1.6 10*3/uL (ref 0.7–3.1)
Lymphs: 38 %
MCH: 30 pg (ref 26.6–33.0)
MCHC: 33.5 g/dL (ref 31.5–35.7)
MCV: 90 fL (ref 79–97)
Monocytes Absolute: 0.4 10*3/uL (ref 0.1–0.9)
Monocytes: 8 %
Neutrophils Absolute: 2.2 10*3/uL (ref 1.4–7.0)
Neutrophils: 51 %
Platelets: 155 10*3/uL (ref 150–450)
RBC: 4.97 x10E6/uL (ref 4.14–5.80)
RDW: 13.4 % (ref 11.6–15.4)
WBC: 4.3 10*3/uL (ref 3.4–10.8)

## 2023-11-18 LAB — HEMOGLOBIN A1C
Est. average glucose Bld gHb Est-mCnc: 114 mg/dL
Hgb A1c MFr Bld: 5.6 % (ref 4.8–5.6)

## 2023-11-18 LAB — HEPATITIS C ANTIBODY: Hep C Virus Ab: NONREACTIVE

## 2023-11-18 LAB — HIV ANTIBODY (ROUTINE TESTING W REFLEX): HIV Screen 4th Generation wRfx: NONREACTIVE

## 2023-11-18 LAB — TESTOSTERONE: Testosterone: 406 ng/dL (ref 264–916)

## 2023-11-18 LAB — VITAMIN D 25 HYDROXY (VIT D DEFICIENCY, FRACTURES): Vit D, 25-Hydroxy: 23 ng/mL — ABNORMAL LOW (ref 30.0–100.0)

## 2023-11-21 ENCOUNTER — Inpatient Hospital Stay (HOSPITAL_BASED_OUTPATIENT_CLINIC_OR_DEPARTMENT_OTHER): Admission: RE | Admit: 2023-11-21 | Source: Ambulatory Visit | Admitting: Radiology

## 2023-11-22 ENCOUNTER — Other Ambulatory Visit: Payer: Self-pay | Admitting: Family Medicine

## 2023-11-22 DIAGNOSIS — E559 Vitamin D deficiency, unspecified: Secondary | ICD-10-CM

## 2023-11-22 DIAGNOSIS — E782 Mixed hyperlipidemia: Secondary | ICD-10-CM

## 2023-11-22 MED ORDER — ROSUVASTATIN CALCIUM 10 MG PO TABS: 10.0000 mg | ORAL_TABLET | Freq: Every day | ORAL | 11 refills | Status: AC

## 2023-11-22 MED ORDER — VITAMIN D (ERGOCALCIFEROL) 1.25 MG (50000 UNIT) PO CAPS: 50000.0000 [IU] | ORAL_CAPSULE | ORAL | 0 refills | Status: DC

## 2023-11-24 ENCOUNTER — Ambulatory Visit (INDEPENDENT_AMBULATORY_CARE_PROVIDER_SITE_OTHER)
Admission: RE | Admit: 2023-11-24 | Discharge: 2023-11-24 | Disposition: A | Discharge status: Home / Self Care | Source: Ambulatory Visit | Attending: Family Medicine | Admitting: Family Medicine

## 2023-11-24 DIAGNOSIS — R1031 Right lower quadrant pain: Secondary | ICD-10-CM

## 2023-12-19 ENCOUNTER — Encounter: Payer: Self-pay | Admitting: Family Medicine

## 2023-12-19 ENCOUNTER — Ambulatory Visit (INDEPENDENT_AMBULATORY_CARE_PROVIDER_SITE_OTHER): Admitting: Family Medicine

## 2023-12-19 VITALS — BP 110/68 | HR 99 | Temp 98.2°F | Resp 16 | Ht 67.0 in | Wt 242.2 lb

## 2023-12-19 DIAGNOSIS — K4090 Unilateral inguinal hernia, without obstruction or gangrene, not specified as recurrent: Secondary | ICD-10-CM

## 2023-12-19 DIAGNOSIS — K219 Gastro-esophageal reflux disease without esophagitis: Secondary | ICD-10-CM

## 2023-12-19 DIAGNOSIS — Z0001 Encounter for general adult medical examination with abnormal findings: Secondary | ICD-10-CM

## 2023-12-19 DIAGNOSIS — E782 Mixed hyperlipidemia: Secondary | ICD-10-CM

## 2023-12-19 MED ORDER — OMEPRAZOLE 20 MG PO CPDR: 20.0000 mg | DELAYED_RELEASE_CAPSULE | Freq: Every day | ORAL | 3 refills | Status: DC

## 2023-12-19 NOTE — Assessment & Plan Note (Signed)
 Scheduled for hernia repair on December 25, 2023. Conflicting exam and ultrasound findings; surgeon confirmed hernia. Discussed risk of bowel strangulation and clinical diagnosis reliance. - Proceed with hernia repair surgery on December 25, 2023.

## 2023-12-19 NOTE — Assessment & Plan Note (Signed)
 Experiencing GERD symptoms. Omeprazole prescribed but not started. Willing to try medication if symptoms persist post-lifestyle changes. Discussed insurance coverage issues. - Resend prescription for omeprazole to pharmacy. - Instruct to take omeprazole on an empty stomach in the morning. - Advise to contact office if insurance does not cover omeprazole for alternatives.

## 2023-12-19 NOTE — Assessment & Plan Note (Signed)
 Has not started Crestor  due to side effect concerns. Prefers lifestyle modifications first. Provided Mediterranean diet information in Spanish. - Reassess cholesterol levels at the end of July. - Provide information on the Mediterranean diet in Spanish.

## 2023-12-19 NOTE — Progress Notes (Signed)
 Subjective:  Patient ID: Dean Henry, male    DOB: 27-Mar-1966  Age: 58 y.o. MRN: [id number]  Chief Complaint  Patient presents with   Annual Exam    Physical      Well Adult Physical: Patient here for a comprehensive physical exam.The patient reports problems - Patient would like to discuss something else for acid reflux Do you take any herbs or supplements that were not prescribed by a doctor? no Are you taking calcium  supplements? no Are you taking aspirin daily? No.  Encounter for general adult medical examination without abnormal findings  Physical ("At Risk" items are starred): Patient's last physical exam was 1 year ago .  Patient wears a seat belt, has smoke detectors, and wears sunscreen with extended sun exposure. Dental Care: annual cleanings, brushes and flosses daily. Ophthalmology/Optometry: Annual visit.  Hearing loss: none Vision impairments: none Last PSA: 2.13  Discussed the use of AI scribe software for clinical note transcription with the patient, who gave verbal consent to proceed.  History of Present Illness   Dean Henry is a 58 year old male who presents for an annual physical exam and follow-up on his hernia and cholesterol management.  He is scheduled for hernia surgery on June 2nd. Despite an ultrasound not showing a hernia, the surgeon confirmed its presence upon physical examination. He experiences intermittent right groin pain, especially after physical activity, which he attributes to the hernia. He has a history of a hernia repair on the opposite side and states he has right sided testicular pain.  He is concerned about his cholesterol management. He has been prescribed Crestor  but has not started it, preferring lifestyle modifications first. He eats healthy foods, including fruits and vegetables, and plans to increase physical activity after recovering from surgery. He is worried about potential side effects of cholesterol medication, fearing it  might make people 'crazy' or 'lazy'. No family history of high cholesterol.  He experiences symptoms of acid reflux and has been prescribed omeprazole, which he has not yet received from the pharmacy. He is managing symptoms with dietary changes and plans to start omeprazole once he receives it. He has no known allergies except to cats.  In terms of social history, he wears a seatbelt while driving, has smoke detectors in his home, but no carbon monoxide detectors. He does not own firearms. He uses sunscreen when at the beach and visits the dentist annually.      12/19/2023    3:08 PM 05/29/2020    8:06 AM  Depression screen PHQ 2/9  Decreased Interest 0 0  Down, Depressed, Hopeless 0 0  PHQ - 2 Score 0 0         10/30/2019   11:19 AM 10/30/2019    6:19 PM 11/02/2019    3:42 AM  Fall Risk  (RETIRED) Patient Fall Risk Level Low fall risk Low fall risk Low fall risk         Past Medical History:  Diagnosis Date   GERD (gastroesophageal reflux disease)    Hyperlipidemia    not taking medication.   Past Surgical History:  Procedure Laterality Date   APPENDECTOMY     HERNIA REPAIR      Family History  Problem Relation Age of Onset   Cancer Mother        ovarian   Kidney failure Father    Cancer Brother        colon   Colon cancer Brother 44   Colon polyps Neg  Hx    Esophageal cancer Neg Hx    Stomach cancer Neg Hx    Rectal cancer Neg Hx    Social History   Socioeconomic History   Marital status: Married    Spouse name: Not on file   Number of children: 3   Years of education: Not on file   Highest education level: Not on file  Occupational History   Occupation: Unemployed  Tobacco Use   Smoking status: Never    Passive exposure: Never   Smokeless tobacco: Never   Tobacco comments:    N/A  Vaping Use   Vaping status: Never Used  Substance and Sexual Activity   Alcohol use: Yes    Alcohol/week: 3.0 standard drinks of alcohol    Types: 3 Shots of liquor per  week    Comment: socially   Drug use: Never   Sexual activity: Yes    Partners: Female  Other Topics Concern   Not on file  Social History Narrative   Not on file   Social Drivers of Health   Financial Resource Strain: Not on File (10/25/2021)   Received from [facility]    Financial Resource Strain: 0  Food Insecurity: Low Risk  (03/29/2023)   Received from [facility]   Hunger Vital Sign    Worried About Running Out of Food in the Last Year: Never true    Ran Out of Food in the Last Year: Never true  Transportation Needs: No Transportation Needs (03/29/2023)   Received from [facility]    In the past 12 months, has lack of reliable transportation kept you from medical appointments, meetings, work or from getting things needed for daily living? : No  Physical Activity: Not on File (10/25/2021)   Received from [place], [place]   Physical Activity    Physical Activity: 0  Stress: Not on File (10/25/2021)   Received from [facility]   Stress    Stress: 0  Social Connections: Not on File (10/25/2021)   Received from [employer]    Connectedness: 0   Review of Systems  Constitutional:  Negative for appetite change, fatigue and fever.  HENT:  Negative for congestion, ear pain, sinus pressure and sore throat.   Eyes: Negative.   Respiratory:  Negative for cough, chest tightness, shortness of breath and wheezing.   Cardiovascular:  Negative for chest pain and palpitations.  Gastrointestinal:  Negative for abdominal pain, constipation, diarrhea, nausea and vomiting.  Endocrine: Negative.   Genitourinary:  Positive for testicular pain (right testicle). Negative for dysuria and hematuria.  Musculoskeletal:  Negative for arthralgias, back pain, joint swelling and myalgias.  Skin:  Negative for rash.  Allergic/Immunologic: Negative.   Neurological:  Negative for dizziness, weakness and headaches.  Hematological: Negative.    Psychiatric/Behavioral:  Negative for dysphoric mood. The patient is not nervous/anxious.      Objective:  BP 110/68   Pulse 99   Temp 98.2 F (36.8 C) (Temporal)   Resp 16   Ht 5\' 7"  (1.702 m)   Wt 242 lb 3.2 oz (109.9 kg)   SpO2 96%   BMI 37.93 kg/m      12/19/2023    1:56 PM 11/15/2023    2:13 PM 05/29/2020    8:05 AM  BP/Weight  Systolic BP 110 110 110  Diastolic BP 68 72 64  Wt. (Lbs) 242.2 246.6 236  BMI 37.93 kg/m2 38.62 kg/m2 36.96 kg/m2  Physical Exam Vitals reviewed.  Constitutional:      Appearance: Normal appearance. He is well-groomed.  HENT:     Head: Normocephalic.     Right Ear: Tympanic membrane, ear canal and external ear normal. There is no impacted cerumen.     Left Ear: Tympanic membrane, ear canal and external ear normal. There is no impacted cerumen.     Nose: Nose normal. No congestion.     Mouth/Throat:     Mouth: Mucous membranes are moist.     Pharynx: Oropharynx is clear. No posterior oropharyngeal erythema.  Eyes:     Extraocular Movements: Extraocular movements intact.     Conjunctiva/sclera: Conjunctivae normal.     Pupils: Pupils are equal, round, and reactive to light.  Cardiovascular:     Rate and Rhythm: Normal rate and regular rhythm.     Pulses: Normal pulses.     Heart sounds: No murmur heard. Pulmonary:     Effort: Pulmonary effort is normal.     Breath sounds: Normal breath sounds. No wheezing.  Chest:     Chest wall: No mass.  Breasts:    Breasts are symmetrical.  Abdominal:     General: Bowel sounds are normal.     Palpations: Abdomen is soft.     Tenderness: There is no abdominal tenderness.     Hernia: A hernia (right) is present.  Musculoskeletal:        General: Normal range of motion.     Cervical back: Normal range of motion and neck supple.  Lymphadenopathy:     Cervical: No cervical adenopathy.  Skin:    General: Skin is warm and dry.  Neurological:     Mental Status: He is alert and oriented to  person, place, and time. Mental status is at baseline.     Cranial Nerves: Cranial nerves 2-12 are intact.     Sensory: Sensation is intact.     Motor: Motor function is intact.     Coordination: Coordination is intact.     Gait: Gait is intact.     Deep Tendon Reflexes: Reflexes are normal and symmetric.  Psychiatric:        Attention and Perception: Attention normal.        Mood and Affect: Mood normal.        Speech: Speech normal.        Behavior: Behavior normal.        Thought Content: Thought content normal.        Judgment: Judgment normal.     Lab Results  Component Value Date   WBC 4.3 11/15/2023   HGB 14.9 11/15/2023   HCT 44.5 11/15/2023   PLT 155 11/15/2023   GLUCOSE 98 05/29/2020   CHOL 222 (H) 11/15/2023   TRIG 236 (H) 11/15/2023   HDL 47 11/15/2023   LDLCALC 133 (H) 11/15/2023   ALT 27 05/29/2020   AST 24 05/29/2020   NA 142 05/29/2020   K 4.7 05/29/2020   CL 102 05/29/2020   CREATININE 0.82 05/29/2020   BUN 14 05/29/2020   CO2 24 05/29/2020   HGBA1C 5.6 11/15/2023      Assessment & Plan:  Encounter for general adult medical examination with abnormal findings Assessment & Plan: Things to do to keep yourself healthy  - Exercise at least 30-45 minutes a day, 3-4 days a week.  - Eat a low-fat diet with lots of fruits and vegetables, up to 7-9 servings per day.  - Seatbelts can save  your life. Wear them always.  - Smoke detectors on every level of your home, check batteries every year.  - Eye Doctor - have an eye exam every 1-2 years  - Safe sex - if you may be exposed to STDs, use a condom.  - Alcohol -  If you drink, do it moderately, less than 2 drinks per day.  - Health Care Power of Attorney. Choose someone to speak for you if you are not able.  - Depression is common in our stressful world.If you're feeling down or losing interest in things you normally enjoy, please come in for a visit.  - Violence - If anyone is threatening or hurting you,  please call immediately.    Gastroesophageal reflux disease without esophagitis Assessment & Plan: Experiencing GERD symptoms. Omeprazole prescribed but not started. Willing to try medication if symptoms persist post-lifestyle changes. Discussed insurance coverage issues. - Resend prescription for omeprazole to pharmacy. - Instruct to take omeprazole on an empty stomach in the morning. - Advise to contact office if insurance does not cover omeprazole for alternatives.  Orders: -     Omeprazole; Take 1 capsule (20 mg total) by mouth daily.  Dispense: 30 capsule; Refill: 3  Inguinal hernia of right side without obstruction or gangrene Assessment & Plan: Scheduled for hernia repair on December 25, 2023. Conflicting exam and ultrasound findings; surgeon confirmed hernia. Discussed risk of bowel strangulation and clinical diagnosis reliance. - Proceed with hernia repair surgery on December 25, 2023.   Hyperlipidemia, mixed Assessment & Plan: Has not started Crestor  due to side effect concerns. Prefers lifestyle modifications first. Provided Mediterranean diet information in Spanish. - Reassess cholesterol levels at the end of July. - Provide information on the Mediterranean diet in Spanish.        Body mass index is 37.93 kg/m.   These are the goals we discussed:  Goals      Manage My Cholesterol     Timeframe:  Short-Term Goal Priority:  High Start Date:                             Expected End Date:                       Follow Up Date 02/18/2024    - change to whole grain breads, cereal, pasta - eat smaller or less servings of red meat - fill half the plate with nonstarchy vegetables - get blood test (fasting) done 1 week before next visit - increase the amount of fiber in food - read food labels for fat and fiber - switch to low-fat or skim milk    Why is this important?  Does not want to take cholesterol medication Changing cholesterol starts with eating heart-healthy foods.   Other steps may be to increase your activity and to quit if you smoke.    Notes:          This is a list of the screening recommended for you and due dates:  Health Maintenance  Topic Date Due   COVID-19 Vaccine (2 - 2024-25 season) 01/04/2024*   Zoster (Shingles) Vaccine (1 of 2) 03/20/2024*   DTaP/Tdap/Td vaccine (1 - Tdap) 12/18/2024*   Flu Shot  02/23/2024   Colon Cancer Screening  12/24/2024   Hepatitis C Screening  Completed   HIV Screening  Completed   HPV Vaccine  Aged Out   Meningitis B Vaccine  Aged Out  *Topic was postponed. The date shown is not the original due date.     Meds ordered this encounter  Medications   omeprazole (PRILOSEC) 20 MG capsule    Sig: Take 1 capsule (20 mg total) by mouth daily.    Dispense:  30 capsule    Refill:  3     Follow-up: Return in about 2 months (around 02/18/2024).  An After Visit Summary was printed and given to the patient.  Delford Felling, FNP [facility] [phone]

## 2023-12-19 NOTE — Assessment & Plan Note (Signed)

## 2024-02-20 ENCOUNTER — Ambulatory Visit: Admitting: Family Medicine

## 2024-02-29 ENCOUNTER — Other Ambulatory Visit: Payer: Self-pay | Admitting: Family Medicine

## 2024-02-29 DIAGNOSIS — E559 Vitamin D deficiency, unspecified: Secondary | ICD-10-CM

## 2024-07-09 ENCOUNTER — Other Ambulatory Visit: Payer: Self-pay | Admitting: Family Medicine

## 2024-07-09 DIAGNOSIS — E559 Vitamin D deficiency, unspecified: Secondary | ICD-10-CM

## 2024-07-09 DIAGNOSIS — K219 Gastro-esophageal reflux disease without esophagitis: Secondary | ICD-10-CM

## 2024-08-01 ENCOUNTER — Ambulatory Visit: Admitting: Family Medicine

## 2024-08-01 ENCOUNTER — Encounter: Payer: Self-pay | Admitting: Family Medicine
